# Patient Record
Sex: Female | Born: 1960 | Race: White | Hispanic: No | Marital: Single | Smoking: Former smoker
Health system: Southern US, Community
[De-identification: ages and names within clinical notes are randomized; demographics above are authoritative.]

## PROBLEM LIST (undated history)

## (undated) DIAGNOSIS — J449 Chronic obstructive pulmonary disease, unspecified: Secondary | ICD-10-CM

---

## 2017-12-07 ENCOUNTER — Emergency Department (HOSPITAL_COMMUNITY)
Admission: EM | Admit: 2017-12-07 | Discharge: 2017-12-07 | Disposition: A | Discharge status: Home / Self Care | Payer: BLUE CROSS/BLUE SHIELD | Attending: Emergency Medicine | Admitting: Emergency Medicine

## 2017-12-07 ENCOUNTER — Encounter (HOSPITAL_COMMUNITY): Payer: Self-pay

## 2017-12-07 ENCOUNTER — Other Ambulatory Visit: Payer: Self-pay

## 2017-12-07 ENCOUNTER — Emergency Department (HOSPITAL_COMMUNITY): Payer: BLUE CROSS/BLUE SHIELD

## 2017-12-07 DIAGNOSIS — M542 Cervicalgia: Secondary | ICD-10-CM | POA: Insufficient documentation

## 2017-12-07 DIAGNOSIS — J449 Chronic obstructive pulmonary disease, unspecified: Secondary | ICD-10-CM | POA: Diagnosis not present

## 2017-12-07 HISTORY — DX: Chronic obstructive pulmonary disease, unspecified: J44.9

## 2017-12-07 NOTE — ED Triage Notes (Signed)
Pt states after an incident in 2007 she has had back and neck problems. States c2 that is possibly bulging. States she "has other things she needs checked today while she is here" and relays a head injury from about a month ago from hitting her head on the freezer door.

## 2017-12-07 NOTE — ED Provider Notes (Signed)
[facility] DEPT Provider Note   CSN: [id number] Arrival date & time: 12/07/17  1316     History   Chief Complaint Chief Complaint  Patient presents with  . Neck Pain    HPI Suzanne Carroll is a 57 y.o. female.  HPI   Patient is a 57 year old female with a history of COPD, fibromyalgia, chronic low back pain and history of neck injury (2007) who presents the emergency department today complaining of neck pain that began several days ago.  Patient states that she was babysitting a small child and felt immediate pain several days ago after lifting the child.  Since then she states she has had pain radiating down the bilateral arms, more so on the right.  She only has intermittent radiation of pain down the left arm.  None currently.  Also reports pain to her right buttock that radiates down the right side of her leg.  She feels this is unrelated to her chronic back pain.  She has tried taking Percocet given to her by her PCP.  She has not followed up with her PCP for the symptoms.  States she has seen neurosurgery in the past for her neck pain and was told that she had "bulging in my C2 ".  She states that her neurosurgeon told her "eventually I may get a herniated disc in this area and I will know it when it happens ".  She denies persistent numbness or weakness to her arms or legs.  No loss control or bowel or bladder function.  No urinary retention.  No fevers chills.  No history of IV drug use or dx of cancer.  Past Medical History:  Diagnosis Date  . COPD (chronic obstructive pulmonary disease) (HCC)     There are no active problems to display for this patient.   History reviewed. No pertinent surgical history.   OB History   None     Home Medications    Prior to Admission medications   Not on File    Family History History reviewed. No pertinent family history.  Social History Social History   Tobacco Use  . Smoking status: Former  Research scientist (life sciences)  . Smokeless tobacco: Never Used  Substance Use Topics  . Alcohol use: Not Currently    Frequency: Never  . Drug use: Never     Allergies   Aspirin   Review of Systems Review of Systems  Constitutional: Negative for fever.  Respiratory: Negative for shortness of breath.   Cardiovascular: Negative for chest pain.  Gastrointestinal: Negative for abdominal pain, blood in stool, constipation, diarrhea, nausea and vomiting.  Genitourinary: Negative for dysuria, flank pain, frequency, hematuria and urgency.  Musculoskeletal: Positive for neck pain. Negative for back pain and gait problem.  Skin: Negative for wound.  Neurological: Negative for weakness and numbness.       Tingling to RUE and RLE (intermittent)     Physical Exam Updated Vital Signs BP 128/90 (BP Location: Left Arm)   Pulse (!) 111   Temp 97.9 F (36.6 C) (Oral)   Resp 18   Ht 5\' 2"  (1.575 m)   Wt 44.5 kg   SpO2 100%   BMI 17.92 kg/m   Physical Exam  Constitutional: No distress.  Thin appearing female.  Appears older than stated age.  HENT:  Head: Normocephalic and atraumatic.  Eyes: Conjunctivae are normal.  Neck: Neck supple.  Cardiovascular: Regular rhythm.  No murmur heard. Borderline tachycardic.  Pulmonary/Chest: Effort normal and  breath sounds normal. No respiratory distress.  Abdominal: Soft. There is no tenderness.  Musculoskeletal: She exhibits no edema.  Mild midline cervical spine tenderness, however patient easily distractible.  No point tenderness to the midline cervical spine.  Tenderness to the bilateral cervical paraspinous muscles and along the right trapezius muscle that reproduces her pain.  Strength 5/5 to the bilateral upper and lower extremity's.  Normal sensation throughout.  Ambulatory without difficulty.  Mild diffuse midline lumbar spine tenderness that she states is chronic.  Neurological: She is alert.  Skin: Skin is warm and dry.  Psychiatric:  Anxious.  Nursing  note and vitals reviewed.  ED Treatments / Results  Labs (all labs ordered are listed, but only abnormal results are displayed) Labs Reviewed - No data to display  EKG None  Radiology Dg Cervical Spine Complete  Result Date: 12/07/2017 CLINICAL DATA:  Neck pain. EXAM: CERVICAL SPINE - COMPLETE 4+ VIEW COMPARISON:  None. FINDINGS: No fracture or spondylolisthesis is noted. Mild degenerative disc disease is noted at C5-6 and C6-7 with anterior osteophyte formation. No prevertebral soft tissue swelling is noted. Mild bilateral neural foraminal stenosis is noted at these levels secondary to uncovertebral spurring. IMPRESSION: Mild degenerative disc disease is noted at C5-6 and C6-7 with mild bilateral neural foraminal stenosis at these levels secondary to uncovertebral spurring. No acute abnormality seen in the cervical spine. Electronically Signed   By: Marijo Conception, M.D.   On: 12/07/2017 16:49    Procedures Procedures (including critical care time)  Medications Ordered in ED Medications - No data to display   Initial Impression / Assessment and Plan / ED Course  I have reviewed the triage vital signs and the nursing notes.  Pertinent labs & imaging results that were available during my care of the patient were reviewed by me and considered in my medical decision making (see chart for details).   Reviewed patient is a [employer].  She filled a prescription for 60 Percocet on 12/05/2017.  She has been receiving Percocet from the same provider in [place] for the last several years.  Final Clinical Impressions(s) / ED Diagnoses   Final diagnoses:  Neck pain   Patient here complaining of neck pain after lifting a small child several days ago.  She has tenderness to the bilateral cervical paraspinous muscles in the right trapezius muscle.  No significant midline cervical spine tenderness, she is very distractible on exam does not have any point tenderness.   Cervical spine x-ray completed which noted mild degenerative disc disease at C5-6 and C6-7 with mild bilateral neural foraminal stenosis at these levels secondary to uncovertebral spurring. No acute abnormality seen in the cervical spine.  Her neurologic exam is normal.  Suspect radiculopathy versus muscle spasm due to recent heavy lifting.  Doubt other acute pathology that would require further imaging or admission to the hospital.  We will have her follow-up with neurosurgery and/or her PCP who currently manages her chronic pain with Percocet as noted above.  Have advised her to return to the ER for any new or worsening symptoms in the meantime.  She voices understanding of the plan and reasons to return to the ED peer all questions answered.  ED Discharge Orders    None       Rodney Booze, [place] 12/07/17 1707    Margette Fast, MD 12/07/17 1718

## 2017-12-07 NOTE — Discharge Instructions (Signed)
You should continue taking your regularly prescribed Percocet at home for your pain.  You may add ibuprofen for your pain as well.  You may use warm or cool compresses for your symptoms.  You were given a referral to the neurosurgery (spine) doctor.  You will need to call the office to make an appointment for follow-up.  If you have any new or worsening symptoms in the meantime such as weakness in your arms or legs, numbness in your arms or legs, fevers, chills or worsening pain then you should return to the emergency department immediately.

## 2017-12-29 ENCOUNTER — Other Ambulatory Visit: Payer: Self-pay | Admitting: Family Medicine

## 2017-12-29 DIAGNOSIS — M542 Cervicalgia: Secondary | ICD-10-CM

## 2017-12-29 DIAGNOSIS — R911 Solitary pulmonary nodule: Secondary | ICD-10-CM

## 2017-12-29 DIAGNOSIS — G8929 Other chronic pain: Secondary | ICD-10-CM

## 2018-01-02 ENCOUNTER — Other Ambulatory Visit (HOSPITAL_COMMUNITY): Payer: Self-pay | Admitting: Family Medicine

## 2018-01-02 DIAGNOSIS — R911 Solitary pulmonary nodule: Secondary | ICD-10-CM

## 2018-01-02 DIAGNOSIS — G8929 Other chronic pain: Secondary | ICD-10-CM

## 2018-01-02 DIAGNOSIS — M542 Cervicalgia: Principal | ICD-10-CM

## 2018-01-16 ENCOUNTER — Other Ambulatory Visit: Payer: BLUE CROSS/BLUE SHIELD

## 2018-02-06 ENCOUNTER — Inpatient Hospital Stay (HOSPITAL_COMMUNITY)
Admission: AD | Admit: 2018-02-06 | Discharge: 2018-02-15 | DRG: 314 | Disposition: A | Discharge status: Skilled Nursing Facility | Payer: BLUE CROSS/BLUE SHIELD | Source: Other Acute Inpatient Hospital | Attending: Internal Medicine | Admitting: Internal Medicine

## 2018-02-06 DIAGNOSIS — E43 Unspecified severe protein-calorie malnutrition: Secondary | ICD-10-CM

## 2018-02-06 DIAGNOSIS — E876 Hypokalemia: Secondary | ICD-10-CM | POA: Diagnosis present

## 2018-02-06 DIAGNOSIS — F411 Generalized anxiety disorder: Secondary | ICD-10-CM | POA: Diagnosis present

## 2018-02-06 DIAGNOSIS — Z681 Body mass index (BMI) 19 or less, adult: Secondary | ICD-10-CM

## 2018-02-06 DIAGNOSIS — C3490 Malignant neoplasm of unspecified part of unspecified bronchus or lung: Secondary | ICD-10-CM | POA: Diagnosis present

## 2018-02-06 DIAGNOSIS — D630 Anemia in neoplastic disease: Secondary | ICD-10-CM | POA: Diagnosis present

## 2018-02-06 DIAGNOSIS — C7951 Secondary malignant neoplasm of bone: Secondary | ICD-10-CM | POA: Diagnosis present

## 2018-02-06 DIAGNOSIS — R64 Cachexia: Secondary | ICD-10-CM | POA: Diagnosis present

## 2018-02-06 DIAGNOSIS — M1990 Unspecified osteoarthritis, unspecified site: Secondary | ICD-10-CM | POA: Diagnosis present

## 2018-02-06 DIAGNOSIS — Z7189 Other specified counseling: Secondary | ICD-10-CM | POA: Diagnosis not present

## 2018-02-06 DIAGNOSIS — J449 Chronic obstructive pulmonary disease, unspecified: Secondary | ICD-10-CM | POA: Diagnosis present

## 2018-02-06 DIAGNOSIS — J9601 Acute respiratory failure with hypoxia: Secondary | ICD-10-CM | POA: Diagnosis present

## 2018-02-06 DIAGNOSIS — R140 Abdominal distension (gaseous): Secondary | ICD-10-CM | POA: Diagnosis present

## 2018-02-06 DIAGNOSIS — Z9889 Other specified postprocedural states: Secondary | ICD-10-CM

## 2018-02-06 DIAGNOSIS — J910 Malignant pleural effusion: Secondary | ICD-10-CM | POA: Diagnosis present

## 2018-02-06 DIAGNOSIS — Z79891 Long term (current) use of opiate analgesic: Secondary | ICD-10-CM

## 2018-02-06 DIAGNOSIS — C787 Secondary malignant neoplasm of liver and intrahepatic bile duct: Secondary | ICD-10-CM | POA: Diagnosis present

## 2018-02-06 DIAGNOSIS — C7900 Secondary malignant neoplasm of unspecified kidney and renal pelvis: Secondary | ICD-10-CM | POA: Diagnosis present

## 2018-02-06 DIAGNOSIS — G893 Neoplasm related pain (acute) (chronic): Secondary | ICD-10-CM | POA: Diagnosis present

## 2018-02-06 DIAGNOSIS — I313 Pericardial effusion (noninflammatory): Secondary | ICD-10-CM | POA: Diagnosis present

## 2018-02-06 DIAGNOSIS — I314 Cardiac tamponade: Secondary | ICD-10-CM | POA: Diagnosis present

## 2018-02-06 DIAGNOSIS — I361 Nonrheumatic tricuspid (valve) insufficiency: Secondary | ICD-10-CM | POA: Diagnosis not present

## 2018-02-06 DIAGNOSIS — C7A1 Malignant poorly differentiated neuroendocrine tumors: Secondary | ICD-10-CM | POA: Diagnosis present

## 2018-02-06 DIAGNOSIS — C801 Malignant (primary) neoplasm, unspecified: Secondary | ICD-10-CM | POA: Diagnosis present

## 2018-02-06 DIAGNOSIS — M4804 Spinal stenosis, thoracic region: Secondary | ICD-10-CM | POA: Diagnosis present

## 2018-02-06 DIAGNOSIS — R0602 Shortness of breath: Secondary | ICD-10-CM | POA: Diagnosis present

## 2018-02-06 DIAGNOSIS — M797 Fibromyalgia: Secondary | ICD-10-CM | POA: Diagnosis present

## 2018-02-06 DIAGNOSIS — Z87891 Personal history of nicotine dependence: Secondary | ICD-10-CM

## 2018-02-06 DIAGNOSIS — Z886 Allergy status to analgesic agent status: Secondary | ICD-10-CM

## 2018-02-06 DIAGNOSIS — Z79899 Other long term (current) drug therapy: Secondary | ICD-10-CM

## 2018-02-06 DIAGNOSIS — K219 Gastro-esophageal reflux disease without esophagitis: Secondary | ICD-10-CM | POA: Diagnosis present

## 2018-02-06 DIAGNOSIS — R000 Tachycardia, unspecified: Secondary | ICD-10-CM | POA: Diagnosis not present

## 2018-02-06 DIAGNOSIS — I509 Heart failure, unspecified: Secondary | ICD-10-CM | POA: Diagnosis present

## 2018-02-06 DIAGNOSIS — Z820 Family history of epilepsy and other diseases of the nervous system: Secondary | ICD-10-CM

## 2018-02-06 DIAGNOSIS — R600 Localized edema: Secondary | ICD-10-CM | POA: Diagnosis not present

## 2018-02-06 DIAGNOSIS — Z801 Family history of malignant neoplasm of trachea, bronchus and lung: Secondary | ICD-10-CM

## 2018-02-06 DIAGNOSIS — Z882 Allergy status to sulfonamides status: Secondary | ICD-10-CM

## 2018-02-06 DIAGNOSIS — J9811 Atelectasis: Secondary | ICD-10-CM | POA: Diagnosis present

## 2018-02-06 DIAGNOSIS — I3131 Malignant pericardial effusion in diseases classified elsewhere: Secondary | ICD-10-CM | POA: Diagnosis present

## 2018-02-06 DIAGNOSIS — Z515 Encounter for palliative care: Secondary | ICD-10-CM

## 2018-02-07 ENCOUNTER — Inpatient Hospital Stay (HOSPITAL_COMMUNITY): Payer: BLUE CROSS/BLUE SHIELD

## 2018-02-07 ENCOUNTER — Encounter (HOSPITAL_COMMUNITY)
Admission: AD | Disposition: A | Discharge status: Skilled Nursing Facility | Payer: Self-pay | Source: Other Acute Inpatient Hospital | Attending: Internal Medicine

## 2018-02-07 DIAGNOSIS — R0602 Shortness of breath: Secondary | ICD-10-CM

## 2018-02-07 DIAGNOSIS — I361 Nonrheumatic tricuspid (valve) insufficiency: Secondary | ICD-10-CM

## 2018-02-07 DIAGNOSIS — I3131 Malignant pericardial effusion in diseases classified elsewhere: Secondary | ICD-10-CM | POA: Diagnosis present

## 2018-02-07 DIAGNOSIS — J910 Malignant pleural effusion: Secondary | ICD-10-CM

## 2018-02-07 DIAGNOSIS — C801 Malignant (primary) neoplasm, unspecified: Secondary | ICD-10-CM | POA: Diagnosis present

## 2018-02-07 DIAGNOSIS — C3490 Malignant neoplasm of unspecified part of unspecified bronchus or lung: Secondary | ICD-10-CM | POA: Diagnosis present

## 2018-02-07 DIAGNOSIS — I313 Pericardial effusion (noninflammatory): Principal | ICD-10-CM

## 2018-02-07 HISTORY — PX: PERICARDIOCENTESIS: CATH118255

## 2018-02-07 LAB — CBC
HCT: 35.8 % — ABNORMAL LOW (ref 36.0–46.0)
Hemoglobin: 10.7 g/dL — ABNORMAL LOW (ref 12.0–15.0)
MCH: 26.3 pg (ref 26.0–34.0)
MCHC: 29.9 g/dL — ABNORMAL LOW (ref 30.0–36.0)
MCV: 88 fL (ref 80.0–100.0)
Platelets: 572 10*3/uL — ABNORMAL HIGH (ref 150–400)
RBC: 4.07 MIL/uL (ref 3.87–5.11)
RDW: 14.9 % (ref 11.5–15.5)
WBC: 10.9 10*3/uL — ABNORMAL HIGH (ref 4.0–10.5)
nRBC: 0 % (ref 0.0–0.2)

## 2018-02-07 LAB — GRAM STAIN

## 2018-02-07 LAB — TYPE AND SCREEN
ABO/RH(D): A POS
Antibody Screen: NEGATIVE

## 2018-02-07 LAB — TROPONIN I
Troponin I: <0.03 ng/mL (ref <0.03)
Troponin I: <0.03 ng/mL (ref <0.03)
Troponin I: 0.03 ng/mL (ref <0.03)

## 2018-02-07 LAB — ECHOCARDIOGRAM COMPLETE
Height: 61 in
Weight: 1628.8 oz

## 2018-02-07 LAB — BASIC METABOLIC PANEL
Anion gap: 12 (ref 5–15)
BUN: 13 mg/dL (ref 6–20)
CO2: 27 mmol/L (ref 22–32)
CREATININE: 0.5 mg/dL (ref 0.44–1.00)
Calcium: 8.4 mg/dL — ABNORMAL LOW (ref 8.9–10.3)
Chloride: 105 mmol/L (ref 98–111)
GFR calc Af Amer: >60 mL/min (ref >60)
GFR calc non Af Amer: >60 mL/min (ref >60)
Glucose, Bld: 105 mg/dL — ABNORMAL HIGH (ref 70–99)
Potassium: 3.3 mmol/L — ABNORMAL LOW (ref 3.5–5.1)
Sodium: 144 mmol/L (ref 135–145)

## 2018-02-07 LAB — BODY FLUID CELL COUNT WITH DIFFERENTIAL
Lymphs, Fluid: 11 %
MONOCYTE-MACROPHAGE-SEROUS FLUID: 56 % (ref 50–90)
Neutrophil Count, Fluid: 33 % — ABNORMAL HIGH (ref 0–25)
Total Nucleated Cell Count, Fluid: 284 cu mm (ref 0–1000)

## 2018-02-07 LAB — ABO/RH: ABO/RH(D): A POS

## 2018-02-07 LAB — MRSA PCR SCREENING: MRSA by PCR: NEGATIVE

## 2018-02-07 SURGERY — PERICARDIOCENTESIS
Anesthesia: LOCAL

## 2018-02-07 MED ORDER — ENOXAPARIN SODIUM 30 MG/0.3ML ~~LOC~~ SOLN
30.00 | SUBCUTANEOUS | Status: DC
Start: 2018-02-05 — End: 2018-02-07

## 2018-02-07 MED ORDER — LORAZEPAM 2 MG/ML IJ SOLN
0.5000 mg | Freq: Four times a day (QID) | INTRAMUSCULAR | Status: DC | PRN
  Administered 2018-02-07 – 2018-02-13 (×3): 0.5 mg via INTRAVENOUS
  Filled 2018-02-07 (×3): qty 1

## 2018-02-07 MED ORDER — MORPHINE SULFATE (CONCENTRATE) 10 MG /0.5 ML PO SOLN
15.00 | ORAL | Status: DC
Start: ? — End: 2018-02-07

## 2018-02-07 MED ORDER — SODIUM CHLORIDE 0.9 % IV SOLN: 250.0000 mL | INTRAVENOUS | Status: DC | PRN

## 2018-02-07 MED ORDER — HEPARIN SODIUM (PORCINE) 5000 UNIT/ML IJ SOLN
5000.0000 [IU] | Freq: Three times a day (TID) | INTRAMUSCULAR | Status: DC
  Administered 2018-02-07 – 2018-02-15 (×23): 5000 [IU] via SUBCUTANEOUS
  Filled 2018-02-07 (×23): qty 1

## 2018-02-07 MED ORDER — ENOXAPARIN SODIUM 40 MG/0.4ML ~~LOC~~ SOLN: 40.0000 mg | SUBCUTANEOUS | Status: DC

## 2018-02-07 MED ORDER — PSYLLIUM 51.7 % PO PACK
1.00 | PACK | ORAL | Status: DC
Start: 2018-02-05 — End: 2018-02-07

## 2018-02-07 MED ORDER — ONDANSETRON HCL 4 MG PO TABS: 4.0000 mg | ORAL_TABLET | Freq: Four times a day (QID) | ORAL | Status: DC | PRN

## 2018-02-07 MED ORDER — GENERIC EXTERNAL MEDICATION
1.00 | Status: DC
Start: 2018-02-05 — End: 2018-02-07

## 2018-02-07 MED ORDER — IPRATROPIUM-ALBUTEROL 0.5-2.5 (3) MG/3ML IN SOLN: 3.0000 mL | RESPIRATORY_TRACT | Status: DC | PRN

## 2018-02-07 MED ORDER — SODIUM CHLORIDE 0.9 % IV SOLN
INTRAVENOUS | Status: AC
  Administered 2018-02-07: 17:00:00 via INTRAVENOUS

## 2018-02-07 MED ORDER — OXYCODONE HCL 5 MG PO TABS
5.0000 mg | ORAL_TABLET | ORAL | Status: DC | PRN
  Administered 2018-02-08 – 2018-02-09 (×3): 10 mg via ORAL
  Filled 2018-02-07 (×3): qty 2

## 2018-02-07 MED ORDER — FENTANYL CITRATE (PF) 100 MCG/2ML IJ SOLN
INTRAMUSCULAR | Status: AC
  Filled 2018-02-07: qty 2

## 2018-02-07 MED ORDER — GENERIC EXTERNAL MEDICATION
Status: DC
Start: ? — End: 2018-02-07

## 2018-02-07 MED ORDER — LIDOCAINE HCL (PF) 1 % IJ SOLN
INTRAMUSCULAR | Status: AC
  Filled 2018-02-07: qty 30

## 2018-02-07 MED ORDER — HEPARIN (PORCINE) IN NACL 1000-0.9 UT/500ML-% IV SOLN
INTRAVENOUS | Status: AC
  Filled 2018-02-07: qty 500

## 2018-02-07 MED ORDER — LORAZEPAM 1 MG PO TABS
1.00 | ORAL_TABLET | ORAL | Status: DC
Start: ? — End: 2018-02-07

## 2018-02-07 MED ORDER — LIDOCAINE HCL (PF) 1 % IJ SOLN
INTRAMUSCULAR | Status: DC | PRN
  Administered 2018-02-07: 10 mL

## 2018-02-07 MED ORDER — POLYETHYLENE GLYCOL 3350 17 G PO PACK
17.00 | PACK | ORAL | Status: DC
Start: 2018-02-05 — End: 2018-02-07

## 2018-02-07 MED ORDER — FENTANYL CITRATE (PF) 100 MCG/2ML IJ SOLN
INTRAMUSCULAR | Status: DC | PRN
  Administered 2018-02-07: 25 ug via INTRAVENOUS

## 2018-02-07 MED ORDER — ALBUTEROL SULFATE HFA 108 (90 BASE) MCG/ACT IN AERS
2.00 | INHALATION_SPRAY | RESPIRATORY_TRACT | Status: DC
Start: ? — End: 2018-02-07

## 2018-02-07 MED ORDER — ONDANSETRON 4 MG PO TBDP
4.00 | ORAL_TABLET | ORAL | Status: DC
Start: ? — End: 2018-02-07

## 2018-02-07 MED ORDER — HEPARIN (PORCINE) IN NACL 1000-0.9 UT/500ML-% IV SOLN
INTRAVENOUS | Status: DC | PRN
  Administered 2018-02-07: 500 mL

## 2018-02-07 MED ORDER — SODIUM CHLORIDE 0.9 % IV SOLN
INTRAVENOUS | Status: DC
  Administered 2018-02-07 (×2): via INTRAVENOUS

## 2018-02-07 MED ORDER — POTASSIUM CHLORIDE CRYS ER 20 MEQ PO TBCR
40.0000 meq | EXTENDED_RELEASE_TABLET | Freq: Once | ORAL | Status: AC
  Administered 2018-02-07: 40 meq via ORAL
  Filled 2018-02-07: qty 2

## 2018-02-07 MED ORDER — SODIUM CHLORIDE 0.9% FLUSH
3.0000 mL | Freq: Two times a day (BID) | INTRAVENOUS | Status: DC
  Administered 2018-02-08 – 2018-02-14 (×12): 3 mL via INTRAVENOUS

## 2018-02-07 MED ORDER — ACETAMINOPHEN 650 MG RE SUPP: 650.0000 mg | Freq: Four times a day (QID) | RECTAL | Status: DC | PRN

## 2018-02-07 MED ORDER — ONDANSETRON HCL 4 MG/2ML IJ SOLN: 4.0000 mg | Freq: Four times a day (QID) | INTRAMUSCULAR | Status: DC | PRN

## 2018-02-07 MED ORDER — ACETAMINOPHEN 325 MG PO TABS: 650.0000 mg | ORAL_TABLET | ORAL | Status: DC | PRN

## 2018-02-07 MED ORDER — ACETAMINOPHEN 325 MG PO TABS: 650.0000 mg | ORAL_TABLET | Freq: Four times a day (QID) | ORAL | Status: DC | PRN

## 2018-02-07 MED ORDER — SODIUM CHLORIDE 0.9% FLUSH: 3.0000 mL | INTRAVENOUS | Status: DC | PRN

## 2018-02-07 MED ORDER — MORPHINE SULFATE (PF) 2 MG/ML IV SOLN
2.0000 mg | INTRAVENOUS | Status: DC | PRN
  Administered 2018-02-07 – 2018-02-15 (×45): 2 mg via INTRAVENOUS
  Filled 2018-02-07 (×46): qty 1

## 2018-02-07 MED ORDER — SENNOSIDES 8.6 MG PO TABS
2.00 | ORAL_TABLET | ORAL | Status: DC
Start: 2018-02-05 — End: 2018-02-07

## 2018-02-07 SURGICAL SUPPLY — 7 items
ELECT DEFIB PAD ADLT CADENCE (PAD) ×2 IMPLANT
PACK CARDIAC CATHETERIZATION (CUSTOM PROCEDURE TRAY) ×2 IMPLANT
PERIVAC PERICARDIOCENTESIS 8.3 (TRAY / TRAY PROCEDURE) ×2 IMPLANT
SHEATH PROBE COVER 6X72 (BAG) ×2 IMPLANT
TRANSDUCER W/STOPCOCK (MISCELLANEOUS) ×2 IMPLANT
TUBING ART PRESS 72  MALE/FEM (TUBING) ×2
TUBING ART PRESS 72 MALE/FEM (TUBING) ×2 IMPLANT

## 2018-02-07 NOTE — Progress Notes (Signed)
Discussed patient with CVTS.  CT scan reviewed by Dr. Darcey Nora showing total obstruction of the right mainstem bronchus and subtotal obstruction of the left mainstem bronchus.  She has a large pericardial effusion 2.5 cm in diameter with RV and RA compression and respiratory variation her mitral valve inflow and is tachycardic.  Dr. Darcey Nora feels that she would not survive hospitalization and I are in agreement that she has a terminal illness and will likely not survive this hospitalization.  She is not a candidate for pericardial window under general anesthesia because once intubated would be ventilator dependent from there on out.  The patient was offered a pericardiocentesis at [facility] and was then sent here to get 1 done and is expecting to have this done.  I had a very long upfront conversation with the patient.  I have explained to her that she has a terminal illness but she will likely not recover from.  Given that she has significant obstruction of both mainstem bronchi, this is the likely etiology of her shortness of breath and not the pericardial effusion although it is probably contributing some.  We would be willing to do the pericardiocentesis as I reviewed the films with Dr. Daneen Schick.  I did explain to the patient though that this was only a temporizing measure and given the likelihood that this is a malignant effusion will likely reaccumulate in the near future.  She is adamant about proceeding with pericardiocentesis.  I explained the risks of the procedure including right ventricular tear, infection, arrhythmia, cardiac arrest and death.  She understands the risk and is willing to proceed.

## 2018-02-07 NOTE — Progress Notes (Addendum)
Progress Note  Patient Name: Suzanne Carroll Date of Encounter: 02/07/2018  Primary Cardiologist: No primary care provider on file.   Subjective   He needs to be very short of breath lying in bed.  She is also nauseated this morning as well.  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  Initially refused chemotherapy and was found to have a large pericardial effusion with RV compression and significant mitral inflow variation with respiration but patient declined pericardiocentesis few days ago and wanted to be closer to home.  Readmitted with worsening shortness of breath with chest CTA showing extensive right lower lobe tumor with bulky thoracic lymphadenopathy large pericardial effusion and multifocal opacities with possible pneumonia and osseous metastasis.  She is now transferred here for possible pericardial window.  Inpatient Medications    Scheduled Meds:  Continuous Infusions: . sodium chloride 75 mL/hr at 02/07/18 0417   PRN Meds: acetaminophen **OR** acetaminophen, ipratropium-albuterol, LORazepam, morphine injection, ondansetron **OR** ondansetron (ZOFRAN) IV   Vital Signs    Vitals:   02/06/18 2220 02/07/18 0231 02/07/18 0413  BP: (!) 125/93 109/89   Pulse:   (!) 109  Resp:   (!) 23  Temp: 98.4 F (36.9 C)  98 F (36.7 C)  TempSrc: Oral  Oral  Weight: 46.2 kg    Height: 5\' 1"  (1.549 m)     No intake or output data in the 24 hours ending 02/07/18 0803 Filed Weights   02/06/18 2220  Weight: 46.2 kg    Telemetry    Sinus tachycardia- Personally Reviewed  ECG    No new EKG to review- Personally Reviewed  Physical Exam   GEN:  Very thin and cachectic appearing in mild distress secondary to shortness of breath Neck: No JVD Cardiac:  Regular and tachycardic, no murmurs, rubs, or gallops.  Respiratory: Clear to auscultation bilaterally. GI: Soft, nontender,  non-distended  MS: No edema; No deformity. Neuro:  Nonfocal  Psych: Normal affect   Labs    Chemistry Recent Labs  Lab 02/07/18 0353  NA 144  K 3.3*  CL 105  CO2 27  GLUCOSE 105*  BUN 13  CREATININE 0.50  CALCIUM 8.4*  GFRNONAA >60  GFRAA >60  ANIONGAP 12     Hematology Recent Labs  Lab 02/07/18 0353  WBC 10.9*  RBC 4.07  HGB 10.7*  HCT 35.8*  MCV 88.0  MCH 26.3  MCHC 29.9*  RDW 14.9  PLT 572*    Cardiac Enzymes Recent Labs  Lab 02/07/18 0353  TROPONINI <0.03   No results for input(s): TROPIPOC in the last 168 hours.   BNPNo results for input(s): BNP, PROBNP in the last 168 hours.   DDimer No results for input(s): DDIMER in the last 168 hours.   Radiology    No results found.  Cardiac Studies   none  Patient Profile     57 y.o. 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  Initially refused chemotherapy and was found to have a large pericardial effusion with RV compression and significant mitral inflow variation with respiration but patient declined pericardiocentesis few days ago and wanted to be closer to home.  Readmitted with worsening shortness of breath with chest CTA showing extensive right lower lobe tumor with bulky thoracic lymphadenopathy large pericardial effusion and multifocal opacities with  possible pneumonia and osseous metastasis.  She is now transferred here for possible pericardial window.  Assessment & Plan    1.  Large pericardial effusion with possible pericardial tamponade on echo at outside hospital -Is likely malignant given her widely metastatic lung CA. -Patient is very cachectic and ill-appearing with widely metastatic disease.  Chest CT showed large lung tumors possible postobstructive pneumonia and multiple osseous metastasis.  I suspect that her pericardial effusion is only part of what is causing her chest discomfort and shortness  of breath. -The patient had refused chemotherapy in the past but now wants to try chemotherapy wants to pericardiocentesis has been done. -I explained to her that she may not be a candidate for pericardial window given the degree of tumor burden in her chest and doing just pericardiocentesis without a window would only be a temporizing measure and the effusion would likely reaccumulate. -I think we need to look at the overall big picture here with widespread metastatic cancer with significant lung involvement.  I recommend getting oncology involved and get their thoughts on patient's prognosis going forward which I suspect is very poor.  She likely should be seen by palliative care. -I will get a stat 2D echocardiogram to reassess but would not proceed with consulting CVTS for pericardial window until goals of care have been addressed further -This plan has been discussed with Dr. Clementeen Graham.  2.  Widespread metastatic small cell lung CA -Recommend immediate oncology consult to determine prognosis going forward which I assume is poor -Likely needs palliative care consult    I have spent a total of 35 minutes with patient reviewing  telemetry, EKGs, labs and examining patient as well as establishing an assessment and plan that was discussed with the patient.  > 50% of time was spent in direct patient care.    For questions or updates, please contact [place] Please consult [email or web address] for contact info under Cardiology/STEMI.      Signed, Fransico Him, MD  02/07/2018, 8:03 AM

## 2018-02-07 NOTE — Progress Notes (Signed)
Echocardiogram 2D Echocardiogram has been performed.  Suzanne Carroll 02/07/2018, 9:33 AM

## 2018-02-07 NOTE — H&P (Signed)
History and Physical    Suzanne Carroll IRW:[id number] DOB: 10-24-1960 DOA: 02/06/2018  Referring MD/NP/PA: Vernell Leep, MD PCP: Patient, No Pcp Per  Patient coming from: [facility] transfer  Chief Complaint: Shortness of breathe  I have personally briefly reviewed patient's old medical records in [place]   HPI: Suzanne Carroll is a 57 y.o. 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  Patient had just recently been hospitalized at [facility] from 12/7 through 12/29.  During her hospitalization she was evaluated and found to have pathologic acute anterior trochanteric fracture of the left hip requiring surgical intervention on 12/8.  CT angiogram of the chest, abdomen, and pelvis showed innumerable tumors involving the kidneys, pancreas, liver, lung, right adrenal, peritoneum,  retroperitoneum, and bone.  Bone biopsies obtained during her surgery revealed small cell lung cancer.  During the hospitalization it appears that oncology has been consulted to discuss starting chemotherapy, but patient reportedly refused.  Patient was also noted to have a pericardial effusion and underwent TEE on 12/12 which showed stable inconsistent pericardial effusion with no signs of RV collapse, compression, or exaggerated respiratory variations.  She had repeat echocardiogram on 12/28 which showed large pericardial effusion with elevated intrapericardial pressure and early tamponade physiology.  Patient was sent to skilled nursing facility at discharge as it appeared at that time she wanted second opinion.   However, developed worsening shortness of breath and pleuritic chest pain radiating to her back on 12/30.  She complains of associated symptoms of leg swelling and joint pains.  Initial work-up revealed at [facility] temperature 97.5, pulse 108, respiration  28, blood pressure 122/81, O2 saturation 98% on 2 L of nasal cannula oxygen labs revealed WBC 10.2, hemoglobin 11.6, platelets 577, sodium 140, potassium 3.3, chloride 105, CO2 30, BUN 16, creatinine 0.4, and glucose 109.  CT angiogram of the chest showed extensive tumor in the right lower lobe with bulky thoracic lymphatic apathy and progressive metastases along the right heart border associated with a large right-sided pleural cardiac effusion and small to moderate right pleural effusion, small left pleural effusion, multifocal lytic osseous metastases, and multifocal opacities possibly reflecting multifocal pneumonia less likely interstitial edema.  Transfer was requested for need of pericardial window.  Patient was accepted to a cardiac telemetry bed as inpatient.  ED Course: As seen above   Review of Systems  Unable to perform ROS: Acuity of condition  Constitutional: Positive for malaise/fatigue.  Respiratory: Positive for shortness of breath.   Cardiovascular: Positive for chest pain and leg swelling.  Musculoskeletal: Positive for joint pain.    Past Medical History:  Diagnosis Date  . COPD (chronic obstructive pulmonary disease) (HCC)     No past surgical history on file.   reports that she has quit smoking. She has never used smokeless tobacco. She reports previous alcohol use. She reports that she does not use drugs.  Allergies  Allergen Reactions  . Aspirin Hives    No family history on file.  Prior to Admission medications   Not on File    Physical Exam:  Constitutional: Hectic appearing female who appears to be in distress Vitals:   02/06/18 2220  BP: (!) 125/93  Temp: 98.4 F (36.9 C)  TempSrc: Oral  Weight: 46.2 kg  Height: 5\' 1"  (1.549 m)   Eyes: PERRL, lids and conjunctivae normal ENMT: Mucous membranes are  moist. Posterior pharynx clear of any exudate or lesions.  Neck: normal, supple, no masses, no thyromegaly.  Positive JVD appreciated. Respiratory:  clear to auscultation bilaterally, no wheezing, no crackles. Normal respiratory effort. No accessory muscle use.  Cardiovascular: Tachycardic.  +2 pitting lower extremity edema. 2+ pedal pulses. No carotid bruits.  Abdomen: no tenderness, no masses palpated. No hepatosplenomegaly. Bowel sounds positive.  Musculoskeletal: no clubbing / cyanosis. No joint deformity upper and lower extremities. Good ROM, no contractures. Normal muscle tone.  Skin: no rashes, lesions, ulcers. No induration Neurologic: CN 2-12 grossly intact. Sensation intact, DTR normal. Strength 5/5 in all 4.  Psychiatric: Alert and oriented x3 with anxious mood.    Labs on Admission: I have personally reviewed following labs and imaging studies  CBC: No results for input(s): WBC, NEUTROABS, HGB, HCT, MCV, PLT in the last 168 hours. Basic Metabolic Panel: No results for input(s): NA, K, CL, CO2, GLUCOSE, BUN, CREATININE, CALCIUM, MG, PHOS in the last 168 hours. GFR: CrCl cannot be calculated (No successful lab value found.). Liver Function Tests: No results for input(s): AST, ALT, ALKPHOS, BILITOT, PROT, ALBUMIN in the last 168 hours. No results for input(s): LIPASE, AMYLASE in the last 168 hours. No results for input(s): AMMONIA in the last 168 hours. Coagulation Profile: No results for input(s): INR, PROTIME in the last 168 hours. Cardiac Enzymes: No results for input(s): CKTOTAL, CKMB, CKMBINDEX, TROPONINI in the last 168 hours. BNP (last 3 results) No results for input(s): PROBNP in the last 8760 hours. HbA1C: No results for input(s): HGBA1C in the last 72 hours. CBG: No results for input(s): GLUCAP in the last 168 hours. Lipid Profile: No results for input(s): CHOL, HDL, LDLCALC, TRIG, CHOLHDL, LDLDIRECT in the last 72 hours. Thyroid Function Tests: No results for input(s): TSH, T4TOTAL, FREET4, T3FREE, THYROIDAB in the last 72 hours. Anemia Panel: No results for input(s): VITAMINB12, FOLATE, FERRITIN, TIBC,  IRON, RETICCTPCT in the last 72 hours. Urine analysis: No results found for: COLORURINE, APPEARANCEUR, LABSPEC, PHURINE, GLUCOSEU, HGBUR, BILIRUBINUR, KETONESUR, PROTEINUR, UROBILINOGEN, NITRITE, LEUKOCYTESUR Sepsis Labs: No results found for this or any previous visit (from the past 240 hour(s)).   Radiological Exams on Admission: No results found.  EKG: Independently reviewed.  Sinus tachycardia 114 bpm  Assessment/Plan Malignant pericardial effusion: Acute.  Patient presents with worsening chest pain and shortness of breath.  Recent echocardiogram from 12/28 at [facility] showed worsening large pericardial effusion with EF 65 to 34%, grade 1 diastolic dysfunction, elevated intrapericardial pressure, and early tamponade physiology.  Patient sent for possible need of pericardial window. - Admit to telemetry bed - Continuous pulse oximetry with nasal cannula oxygen as needed - N.p.o. for possible procedure - Morphine IV as needed pain - Dr. Regis Bill of Cardiology consulted who recommended placing patient on gentle IV fluids  Metastatic small cell lung cancer: Patient recently diagnosed with small cell lung cancer with metastases noted in several locations after suffering pathological fracture.  Review of records shows the patient was evaluated by oncology, but did not want to start any chemotherapy or radiation. - Consider palliative care consult in a.m.  Anxiety - Ativan IV as needed for anxiety  DVT prophylaxis: SCDs   Code Status: Full  Family Communication: No family present at bedside Disposition Plan: To be determined Consults called: Cardiology Admission status: Inpatient  Norval Morton MD [facility] Pager [phone]   If 7PM-7AM, please contact night-coverage [email or web address] Password [facility]  02/07/2018, 12:57 AM

## 2018-02-07 NOTE — CV Procedure (Signed)
   Pericardiocentesis performed from the subxiphoid approach after local 1% Xylocaine infiltration.  Fluoroscopic and ultrasound guidance was used.  Greater than 800 cc of bloody fluid was evacuated from the pericardial space.  Subjective improvement in dyspnea occurred.  Pericardial fluid was sent for chemical analysis, cultures, and cytology.  No immediate complications noted.

## 2018-02-07 NOTE — Consult Note (Signed)
Cardiology Consultation Note    Patient ID: Suzanne Carroll, MRN: [id number], DOB/AGE: 04-11-1960 57 y.o. Admit date: 02/06/2018   Date of Consult: 02/07/2018 Primary Physician: Patient, No Pcp Per   Reason for Consultation: Malignant pericardial effusion Requesting MD: Dr. Tamala Julian  HPI: Suzanne Carroll is a 57 y.o. 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  She was hospitalized at [facility] from 12/7 through 12/29; the patient initially presented with acute hip pain and inability to bear weight, was ultimately found to have a pathologic left hip fracture that required surgical intervention.  Bone biopsy was consistent with metastatic small cell lung cancer.  She had CT which showed widely metastatic disease, with tumors involving the kidneys, pancreas, liver, and spine.  Oncology had been consulted and recommended immediately initiating chemotherapy, however, the patient did not agree to this.  She ultimately requested to be discharged to seek a second opinion.  During her hospital stay, she was also found to have a large pericardial effusion.  Initially there was no evidence of tamponade physiology, however repeat echo on 12/28 showed RV compression and significant mitral mitral inflow variation.  Pericardiocentesis were recommended, however the patient wanted to defer this until she was closer to home.  The patient was discharged on 12/29 to a skilled nursing facility.  She presented to [facility] yesterday with worsening shortness of breath.  Chest CTA showed extensive right lower lobe tumor, bulky thoracic lymphadenopathy, large pericardial effusion, multifocal opacities with possible pneumonia, and multifocal osseous metastases.  She was then transferred to [facility] for consideration of possible pericardiocentesis or pericardial window.  Upon my interview, the patient endorses significant shortness  of breath and pleuritic chest discomfort.    Past Medical History:  Diagnosis Date  . COPD (chronic obstructive pulmonary disease) ([place])       Surgical History: No past surgical history on file.   Home Meds: Prior to Admission medications   Not on File    Inpatient Medications:   . sodium chloride      Allergies:  Allergies  Allergen Reactions  . Aspirin Hives    Social History   Socioeconomic History  . Marital status: Single    Spouse name: Not on file  . Number of children: Not on file  . Years of education: Not on file  . Highest education level: Not on file  Occupational History  . Not on file  Social Needs  . Financial resource strain: Not on file  . Food insecurity:    Worry: Not on file    Inability: Not on file  . Transportation needs:    Medical: Not on file    Non-medical: Not on file  Tobacco Use  . Smoking status: Former Research scientist (life sciences)  . Smokeless tobacco: Never Used  Substance and Sexual Activity  . Alcohol use: Not Currently    Frequency: Never  . Drug use: Never  . Sexual activity: Not on file  Lifestyle  . Physical activity:    Days per week: Not on file    Minutes per session: Not on file  . Stress: Not on file  Relationships  . Social connections:    Talks on phone: Not on file    Gets together: Not on file    Attends religious service: Not on file    Active member of club or organization: Not on file    Attends meetings  of clubs or organizations: Not on file    Relationship status: Not on file  . Intimate partner violence:    Fear of current or ex partner: Not on file    Emotionally abused: Not on file    Physically abused: Not on file    Forced sexual activity: Not on file  Other Topics Concern  . Not on file  Social History Narrative  . Not on file     No family history on file.   Review of Systems: All other systems reviewed and are otherwise negative except as noted above.  Labs: 140/3.3  105/30  16/0.4 10.2 > 11.6 <  577  Radiology/Studies:  No results found.  Wt Readings from Last 3 Encounters:  02/06/18 46.2 kg  12/07/17 44.5 kg    EKG: Sinus tachycardia with PACs, low voltage  Physical Exam: Blood pressure 109/89, temperature 98.4 F (36.9 C), temperature source Oral, height 5\' 1"  (1.549 m), weight 46.2 kg. Body mass index is 19.23 kg/m. General: Cachectic woman sitting upright in bed, mildly tachypneic Head: Normocephalic, atraumatic, sclera non-icteric, no xanthomas, nares are without discharge.  Neck: JVP significantly elevated with prominent V wave Lungs: Decreased breath sounds at the right lower lobe, intermittent crackles Heart: Tachycardic, regular with S1 S2. No murmurs, rubs, or gallops appreciated. Abdomen: Soft, non-tender, non-distended with normoactive bowel sounds. No hepatomegaly. No rebound/guarding. No obvious abdominal masses. Msk:  Strength and tone appear normal for age. Extremities: No clubbing or cyanosis.  2+ pitting edema of the bilateral lower extreme.  Distal pedal pulses are 2+ and equal bilaterally. Neuro: Alert and oriented X 3. No facial asymmetry. No focal deficit. Moves all extremities spontaneously. Psych:  Responds to questions appropriately with a normal affect.     Assessment and Plan  57 year old lady with widely metastatic small cell lung cancer and large pericardial effusion with signs of tamponade physiology who presents to [facility] for consideration of drainage of pericardial effusion.  This patient is in a very unfortunate situation, and a clear plan regarding her goals of care must be established before determining the best path forward.  In talking to the patient, she seems to be fixated on the hope that drainage of her pericardial effusion will rapidly improve her symptoms, but is unwilling to discuss the bigger picture with her cancer diagnosis.  Would recommend oncology and palliative care consultations in the morning to help frame these  challenging conversations.  While she does have signs of tamponade physiology, her shortness of breath and chest discomfort is likely multifactorial in etiology, with contributions from large lung tumors, possible postobstructive pneumonia, and multiple osseous metastases.  Would strongly urge that the bigger picture be addressed urgently before determining the best procedural approach.  She will need a repeat echocardiogram, mostly to determine feasibility of pericardiocentesis.  Recommend gentle IV fluid hydration given signs of tamponade physiology, however at this point the patient does not have an IV.  Would also recommend opiates and benzodiazepines to improve air hunger, patient comfort and anxiety.  We will follow along with you.  Signed, Doylene Canning, MD 02/07/2018, 3:43 AM

## 2018-02-07 NOTE — Progress Notes (Signed)
   02/07/18 1200  Clinical Encounter Type  Visited With Patient  Visit Type Initial  Referral From Nurse  Responded to page for [facility] consult. Patient was up in bed and nervous about her condition. She believes she can beat the cancer and wants to have the pericardial procedure. I supported her and said spiritual care will be praying for her during [place] on Wednesday and invited her to come at 12 if she felted up to it. Provided emotional and spiritual support.

## 2018-02-07 NOTE — Progress Notes (Addendum)
PROGRESS NOTE                                                                                                                                                                                                             Patient Demographics:    Suzanne Carroll, is a 57 y.o. female, DOB - [birth year], YDX:[id number]  Admit date - 02/06/2018   Admitting Physician Norval Morton, MD  Outpatient Primary MD for the patient is Patient, No Pcp Per  LOS - 1  Outpatient Specialists: None  No chief complaint on file.      Brief Narrative 57 year old female with recently diagnosed metastatic small cell carcinoma to the bone.  She was hospitalized from 12/7-12/29 at [facility] with pathological acute anterior trochanteric fracture of the left hip requiring surgical intervention on 12/8.  During that time a CT angiogram of the chest, abdomen and pelvis showed multiple tumors involving the kidneys, pancreas, liver, lung, right adrenal, peritoneum, retroperitoneum and bone with lumbar vertebral involvement.  Bone biopsy done during surgery showed finding of small cell cancer likely lung primary. Oncology was consulted during hospitalization and offered chemotherapy and radiation however patient refused and wanted to return back with her family in [place] and get worked up.  She was also found to have large pericardial effusion and underwent TEE on 12/12 showing inconsistent pericardial effusion with no signs of RV collapse, compression.  Repeat echo on 12/28 showed large pericardial effusion with elevated intrapericardial pressure and early tamponade physiology.  Patient however refused intervention and was sent to skilled nursing facility. However she started having worsening shortness of breath and pleuritic chest pain radiating to her back on 12/30.  Also complained of leg swelling and shin pain.  She was presented at end of hospital ED where  she was tachycardic in low 100s, tachypneic with stable blood pressure and afebrile.  O2 sat of 98% on 2 L.  Blood work showed mild leukocytosis, hemoglobin of 11.6, platelets of 577, K of 3.3, normal renal function.  CT angiogram of the chest showed extensive tumor in the right lower lobe with bulky thoracic lymphadenopathy, progressive metastases along the right heart border with a large right-sided pericardial effusion and small-to-moderate right pleural effusion, small left pleural effusion, multifocal lytic osseous metastases. Patient transferred to [facility] for cardiology evaluation and pericardial window.  Subjective:   Patient complains of off-and-on chest pain.  Insists on getting pericardial window and treated for her underlying malignancy.   Assessment  & Plan :    Principal Problem:   Malignant pericardial effusion (HCC) Repeat 2D echo shows large pericardial effusion with tamponade physiology.  CVTS consulted by cardiology who reviewed the CT scan which shows total obstruction of the right mainstem bronchus and subtotal obstruction of the left mainstem bronchus.  Dr. Darcey Nora recommended that she would not survive hospitalization due to her terminal illness and that she is not a candidate for pericardial window since once she gets intubated she would be ventilator dependent. Patient wanted to get pericardiocentesis and will be done today. Prognosis is guarded.  Further recommendation per cardiology  Active Problems:    Metastatic small cell lung cancer ([place]) Multiple visceral and osseous metastases as outlined above.  Oncology consulted (will be seen by Dr. Julien Nordmann).  Patient wishes for full scope of treatment.  Prognosis appears guarded.  Patient was not interested in palliative care discussion.  Acute respiratory failure with hypoxia Currently maintaining sats on 2 L.  Continue telemetry monitoring.  Severe protein calorie malnutrition/cachexia Nutrition  consult.  Hypokalemia Replenished.  Code Status : Full code  Family Communication  : None at bedside  Disposition Plan  : Pending hospital course.  Prognosis is guarded  Barriers For Discharge : Active symptoms  Consults  : Cardiology, oncology  Procedures  : 2D echo, pericardiocentesis  DVT Prophylaxis  :  none  Lab Results  Component Value Date   PLT 572 (H) 02/07/2018    Antibiotics  :    Anti-infectives (From admission, onward)   None        Objective:   Vitals:   02/07/18 0413 02/07/18 0811 02/07/18 0812 02/07/18 1158  BP:  110/89  129/88  Pulse: (!) 109 (!) 113    Resp: (!) 23  20 (!) 21  Temp: 98 F (36.7 C)  98.4 F (36.9 C) 98.2 F (36.8 C)  TempSrc: Oral  Oral Oral  SpO2:  96%    Weight:      Height:        Wt Readings from Last 3 Encounters:  02/06/18 46.2 kg  12/07/17 44.5 kg     Intake/Output Summary (Last 24 hours) at 02/07/2018 1519 Last data filed at 02/07/2018 1000 Gross per 24 hour  Intake 428.53 ml  Output -  Net 428.53 ml     Physical Exam  Gen: Middle aged cachectic female in some distress HEENT: Pallor present, temporal wasting,, moist mucosa, supple neck Chest: Diminished bibasilar breath sounds. CVS: S1 and S2 tachycardic, no murmurs rub or gallop GI: soft, NT, ND, BS+ Musculoskeletal: warm, no edema     Data Review:    CBC Recent Labs  Lab 02/07/18 0353  WBC 10.9*  HGB 10.7*  HCT 35.8*  PLT 572*  MCV 88.0  MCH 26.3  MCHC 29.9*  RDW 14.9    Chemistries  Recent Labs  Lab 02/07/18 0353  NA 144  K 3.3*  CL 105  CO2 27  GLUCOSE 105*  BUN 13  CREATININE 0.50  CALCIUM 8.4*   ------------------------------------------------------------------------------------------------------------------ No results for input(s): CHOL, HDL, LDLCALC, TRIG, CHOLHDL, LDLDIRECT in the last 72 hours.  No results found for:  HGBA1C ------------------------------------------------------------------------------------------------------------------ No results for input(s): TSH, T4TOTAL, T3FREE, THYROIDAB in the last 72 hours.  Invalid input(s): FREET3 ------------------------------------------------------------------------------------------------------------------ No results for input(s): VITAMINB12, FOLATE, FERRITIN, TIBC, IRON, RETICCTPCT in the last  72 hours.  Coagulation profile No results for input(s): INR, PROTIME in the last 168 hours.  No results for input(s): DDIMER in the last 72 hours.  Cardiac Enzymes Recent Labs  Lab 02/07/18 0353 02/07/18 0755  TROPONINI <0.03 <0.03   ------------------------------------------------------------------------------------------------------------------ No results found for: BNP  Inpatient Medications  Scheduled Meds: Continuous Infusions: . sodium chloride 75 mL/hr at 02/07/18 0417   PRN Meds:.[MAR Hold] acetaminophen **OR** [MAR Hold] acetaminophen, Heparin (Porcine) in NaCl, [MAR Hold] ipratropium-albuterol, lidocaine (PF), [MAR Hold] LORazepam, [MAR Hold]  morphine injection, [MAR Hold] ondansetron **OR** [MAR Hold] ondansetron (ZOFRAN) IV  Micro Results No results found for this or any previous visit (from the past 240 hour(s)).  Radiology Reports Dg Chest Port 1 View  Result Date: 02/07/2018 CLINICAL DATA:  Cardiac tamponade. Shortness of breath. Small-cell lung cancer. EXAM: PORTABLE CHEST 1 VIEW COMPARISON:  None. FINDINGS: There is marked enlargement of the cardiac silhouette. Pulmonary vascularity is at the upper limits of normal. There are small bilateral pleural effusions, right greater than left. There is slight haziness in both upper lobes most likely representing mild pulmonary edema. Aortic atherosclerosis. No significant bone abnormality. Calcified left breast implant. IMPRESSION: Enlarged cardiac silhouette consistent with pericardial effusion.  Mild pulmonary edema. Small effusions, right greater than left. Electronically Signed   By: Lorriane Shire M.D.   On: 02/07/2018 12:43    Time Spent in minutes  20   Suzanne Carroll M.D on 02/07/2018 at 3:19 PM  Between 7am to 7pm - Pager - [phone]  After 7pm go to [email or web address] - password [facility]  [facility] -  Office  [phone]

## 2018-02-07 NOTE — Progress Notes (Signed)
  Subjective: Patient examined and images of today's echocardiogram and recent CT scan of chest reviewed and counseled with patient.  The patient is a 57 year old cachectic female with advanced age small cell carcinoma lung with severely enlarged central mediastinal and subcarinal adenopathy with total obstruction of the right mainstem bronchus and subtotal obstruction of the left mainstem bronchus.  She does have a large pericardial effusion measuring approximately 2.5 cm in diameter with some RV and RA compression.  Her blood pressure is stable with heart rate 110.  She complains of shortness of breath but her saturation is normal.  The patient has widespread small cell cancer and will probably not survive this hospitalization. The patient would not survive general anesthesia and once intubated would be dependent on ventilator.  I told the patient that I do not feel she would survive or benefit from surgery.  The patient was offered pericardiocentesis at the [facility] and asked about that option.  I have discussed that with Dr. Radford Pax who will talk to an interventional cardiologist who assessed the patient. Objective: Vital signs in last 24 hours: Temp:  [98 F (36.7 C)-98.4 F (36.9 C)] 98.2 F (36.8 C) (12/31 1158) Pulse Rate:  [109-113] 113 (12/31 0811) Cardiac Rhythm: Sinus tachycardia (12/31 0819) Resp:  [20-23] 21 (12/31 1158) BP: (109-129)/(88-93) 129/88 (12/31 1158) SpO2:  [96 %] 96 % (12/31 0811) Weight:  [46.2 kg] 46.2 kg (12/30 2220)  Hemodynamic parameters for last 24 hours:    Intake/Output from previous day: No intake/output data recorded. Intake/Output this shift: Total I/O In: 428.5 [I.V.:428.5] Out: -   Exam Cachectic fragile appearing middle-aged female tachypneic but able to converse and is still mentating appropriately Diminished breath sounds on the right side Positive JVD Sinus tachycardia without friction rub Abdomen distended Lower legs with pitting  edema above the knees  Lab Results: Recent Labs    02/07/18 0353  WBC 10.9*  HGB 10.7*  HCT 35.8*  PLT 572*   BMET:  Recent Labs    02/07/18 0353  NA 144  K 3.3*  CL 105  CO2 27  GLUCOSE 105*  BUN 13  CREATININE 0.50  CALCIUM 8.4*    PT/INR: No results for input(s): LABPROT, INR in the last 72 hours. ABG No results found for: PHART, HCO3, TCO2, ACIDBASEDEF, O2SAT CBG (last 3)  No results for input(s): GLUCAP in the last 72 hours.  Assessment/Plan: S/P  Probable malignant pericardial effusion from advanced age small cell carcinoma.  Patient would not survive general anesthesia and I would not recommend subxiphoid pericardial window.   These issues were discussed directly with the patient.  Tharon Aquas Trigt III 02/07/2018

## 2018-02-07 NOTE — H&P (View-Only) (Signed)
Discussed patient with CVTS.  CT scan reviewed by Dr. Darcey Nora showing total obstruction of the right mainstem bronchus and subtotal obstruction of the left mainstem bronchus.  She has a large pericardial effusion 2.5 cm in diameter with RV and RA compression and respiratory variation her mitral valve inflow and is tachycardic.  Dr. Darcey Nora feels that she would not survive hospitalization and I are in agreement that she has a terminal illness and will likely not survive this hospitalization.  She is not a candidate for pericardial window under general anesthesia because once intubated would be ventilator dependent from there on out.  The patient was offered a pericardiocentesis at [facility] and was then sent here to get 1 done and is expecting to have this done.  I had a very long upfront conversation with the patient.  I have explained to her that she has a terminal illness but she will likely not recover from.  Given that she has significant obstruction of both mainstem bronchi, this is the likely etiology of her shortness of breath and not the pericardial effusion although it is probably contributing some.  We would be willing to do the pericardiocentesis as I reviewed the films with Dr. Daneen Schick.  I did explain to the patient though that this was only a temporizing measure and given the likelihood that this is a malignant effusion will likely reaccumulate in the near future.  She is adamant about proceeding with pericardiocentesis.  I explained the risks of the procedure including right ventricular tear, infection, arrhythmia, cardiac arrest and death.  She understands the risk and is willing to proceed.

## 2018-02-07 NOTE — Interval H&P Note (Signed)
Cath Lab Visit (complete for each Cath Lab visit)  Clinical Evaluation Leading to the Procedure:   ACS: No.  Non-ACS:    Anginal Classification: CCS IV  Anti-ischemic medical therapy: No Therapy  Non-Invasive Test Results: No non-invasive testing performed  Prior CABG: No previous CABG      History and Physical Interval Note:  02/07/2018 1:32 PM  Suzanne Carroll  has presented today for surgery, with the diagnosis of pericardiocentesis  The various methods of treatment have been discussed with the patient and family. After consideration of risks, benefits and other options for treatment, the patient has consented to  Procedure(s): PERICARDIOCENTESIS (N/A) as a surgical intervention .  The patient's history has been reviewed, patient examined, no change in status, stable for surgery.  I have reviewed the patient's chart and labs.  Questions were answered to the patient's satisfaction.     Belva Crome III

## 2018-02-08 DIAGNOSIS — R000 Tachycardia, unspecified: Secondary | ICD-10-CM

## 2018-02-08 DIAGNOSIS — J910 Malignant pleural effusion: Secondary | ICD-10-CM

## 2018-02-08 DIAGNOSIS — Z87891 Personal history of nicotine dependence: Secondary | ICD-10-CM

## 2018-02-08 DIAGNOSIS — R600 Localized edema: Secondary | ICD-10-CM

## 2018-02-08 DIAGNOSIS — Z9889 Other specified postprocedural states: Secondary | ICD-10-CM

## 2018-02-08 DIAGNOSIS — I314 Cardiac tamponade: Secondary | ICD-10-CM

## 2018-02-08 DIAGNOSIS — C7951 Secondary malignant neoplasm of bone: Secondary | ICD-10-CM

## 2018-02-08 DIAGNOSIS — J9601 Acute respiratory failure with hypoxia: Secondary | ICD-10-CM

## 2018-02-08 DIAGNOSIS — R64 Cachexia: Secondary | ICD-10-CM

## 2018-02-08 LAB — CBC
HCT: 35.2 % — ABNORMAL LOW (ref 36.0–46.0)
Hemoglobin: 10.6 g/dL — ABNORMAL LOW (ref 12.0–15.0)
MCH: 26.4 pg (ref 26.0–34.0)
MCHC: 30.1 g/dL (ref 30.0–36.0)
MCV: 87.8 fL (ref 80.0–100.0)
NRBC: 0 % (ref 0.0–0.2)
Platelets: 515 10*3/uL — ABNORMAL HIGH (ref 150–400)
RBC: 4.01 MIL/uL (ref 3.87–5.11)
RDW: 15 % (ref 11.5–15.5)
WBC: 14.8 10*3/uL — ABNORMAL HIGH (ref 4.0–10.5)

## 2018-02-08 LAB — PROTEIN, BODY FLUID (OTHER): TOTAL PROTEIN, BODY FLUID OTHER: 3.7 g/dL

## 2018-02-08 LAB — BASIC METABOLIC PANEL
Anion gap: 9 (ref 5–15)
BUN: 9 mg/dL (ref 6–20)
CALCIUM: 7.9 mg/dL — AB (ref 8.9–10.3)
CO2: 24 mmol/L (ref 22–32)
Chloride: 110 mmol/L (ref 98–111)
Creatinine, Ser: 0.43 mg/dL — ABNORMAL LOW (ref 0.44–1.00)
GFR calc Af Amer: >60 mL/min (ref >60)
GFR calc non Af Amer: >60 mL/min (ref >60)
Glucose, Bld: 122 mg/dL — ABNORMAL HIGH (ref 70–99)
Potassium: 4.2 mmol/L (ref 3.5–5.1)
Sodium: 143 mmol/L (ref 135–145)

## 2018-02-08 LAB — ECHOCARDIOGRAM LIMITED
Height: 61 in
Weight: 1628.8 oz

## 2018-02-08 LAB — LD, BODY FLUID (OTHER): LD, Body Fluid: 452 IU/L

## 2018-02-08 LAB — GLUCOSE, BODY FLUID OTHER: Glucose, Body Fluid Other: 92 mg/dL

## 2018-02-08 LAB — CREATININE, SERUM
Creatinine, Ser: 0.41 mg/dL — ABNORMAL LOW (ref 0.44–1.00)
GFR calc Af Amer: >60 mL/min (ref >60)

## 2018-02-08 NOTE — Progress Notes (Addendum)
Progress Note  Patient Name: Suzanne Carroll Date of Encounter: 02/08/2018  Primary Cardiologist: No primary care provider on file.   Subjective   Underwent pericardiocentesis of 900cc of bloody fluid yesterday. She has drained 55cc since then of bloody fluid. Says her breathing has improved but appears SOB sitting in chair  Inpatient Medications    Scheduled Meds: . heparin  5,000 Units Subcutaneous Q8H  . sodium chloride flush  3 mL Intravenous Q12H   Continuous Infusions: . sodium chloride     PRN Meds: sodium chloride, acetaminophen, ipratropium-albuterol, LORazepam, morphine injection, ondansetron (ZOFRAN) IV, oxyCODONE, sodium chloride flush   Vital Signs    Vitals:   02/08/18 0700 02/08/18 0719 02/08/18 0800 02/08/18 0900  BP: 107/88  (!) 123/97   Pulse: (!) 102  99   Resp: 12  18 17   Temp:  98.4 F (36.9 C)    TempSrc:  Oral    SpO2: 98%  97%   Weight:      Height:        Intake/Output Summary (Last 24 hours) at 02/08/2018 1006 Last data filed at 02/08/2018 0900 Gross per 24 hour  Intake 539.12 ml  Output 55 ml  Net 484.12 ml   Filed Weights   02/06/18 2220  Weight: 46.2 kg    Telemetry    Sinus tachycardia- Personally Reviewed  ECG    No new EKG to review- Personally Reviewed  Physical Exam   GEN: very thin and cachectic ill appearing HEENT: Normal NECK: No JVD; No carotid bruits LYMPHATICS: No lymphadenopathy CARDIAC: regular but tachy, no murmurs, rubs, gallops RESPIRATORY:  Clear to auscultation without rales, wheezing or rhonchi  ABDOMEN: Soft, non-tender, non-distended MUSCULOSKELETAL:  3+ pitting pedal edema; No deformity  SKIN: Warm and dry NEUROLOGIC:  Alert and oriented x 3 PSYCHIATRIC:  Normal affect    Labs    Chemistry Recent Labs  Lab 02/07/18 0353 02/08/18 0051  NA 144  --   K 3.3*  --   CL 105  --   CO2 27  --   GLUCOSE 105*  --   BUN 13  --   CREATININE 0.50 0.41*  CALCIUM 8.4*  --   GFRNONAA >60 >60  GFRAA >60  >60  ANIONGAP 12  --      Hematology Recent Labs  Lab 02/07/18 0353 02/08/18 0051  WBC 10.9* 14.8*  RBC 4.07 4.01  HGB 10.7* 10.6*  HCT 35.8* 35.2*  MCV 88.0 87.8  MCH 26.3 26.4  MCHC 29.9* 30.1  RDW 14.9 15.0  PLT 572* 515*    Cardiac Enzymes Recent Labs  Lab 02/07/18 0353 02/07/18 0755 02/07/18 1639  TROPONINI <0.03 <0.03 0.03*   No results for input(s): TROPIPOC in the last 168 hours.   BNPNo results for input(s): BNP, PROBNP in the last 168 hours.   DDimer No results for input(s): DDIMER in the last 168 hours.   Radiology    Dg Chest Port 1 View  Result Date: 02/07/2018 CLINICAL DATA:  Cardiac tamponade. Shortness of breath. Small-cell lung cancer. EXAM: PORTABLE CHEST 1 VIEW COMPARISON:  None. FINDINGS: There is marked enlargement of the cardiac silhouette. Pulmonary vascularity is at the upper limits of normal. There are small bilateral pleural effusions, right greater than left. There is slight haziness in both upper lobes most likely representing mild pulmonary edema. Aortic atherosclerosis. No significant bone abnormality. Calcified left breast implant. IMPRESSION: Enlarged cardiac silhouette consistent with pericardial effusion. Mild pulmonary edema. Small effusions, right greater  than left. Electronically Signed   By: Lorriane Shire M.D.   On: 02/07/2018 12:43    Cardiac Studies   2D echo 02/07/2018 post pericardiocentesis Study Conclusions  - Left ventricle: Systolic function was vigorous. The estimated   ejection fraction was in the range of 65% to 70%. - Pericardium, extracardiac: A large pericardial effusion was   identified circumferential to the heart. There was right atrial   and right ventricular chamber collapse. Features were consistent   with tamponade physiology.  Impressions:  - Post pericardiocentesis images show resolution of pericardial   effusion.  2D echo 02/07/2018 Study Conclusions  - Left ventricle: The cavity size was  normal. Wall thickness was   normal. Systolic function was normal. The estimated ejection   fraction was in the range of 60% to 65%. Wall motion was normal;   there were no regional wall motion abnormalities. The study is   not technically sufficient to allow evaluation of LV diastolic   function. - Aortic valve: There was trivial regurgitation. - Tricuspid valve: There was mild-moderate regurgitation directed   centrally. - Pulmonary arteries: Systolic pressure was mildly increased. PA   peak pressure: 38 mm Hg (S). - Pericardium, extracardiac: A large, free-flowing pericardial   effusion was identified circumferential to the heart. The fluid   had no internal echoes. There was chamber collapse. Features were   consistent with tamponade physiology.   Patient Profile     58 y.o. 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  Initially refused chemotherapy and was found to have a large pericardial effusion with RV compression and significant mitral inflow variation with respiration but patient declined pericardiocentesis few days ago and wanted to be closer to home.  Readmitted with worsening shortness of breath with chest CTA showing extensive right lower lobe tumor with bulky thoracic lymphadenopathy large pericardial effusion and multifocal opacities with possible pneumonia and osseous metastasis.  She is now transferred here for possible pericardial window.  Assessment & Plan    1.  Large pericardial effusion/tamponade  -Is likely malignant given her widely metastatic lung CA. -CVTS deemed her not a pericardial window candidate due to severe load of Ca with obstruction of right main stem bronchus and partial obstruction of left -Patient is very cachectic and ill-appearing with widely metastatic disease.  Chest CT showed large lung tumors possible postobstructive pneumonia and multiple osseous  metastasis.  I suspect that her pericardial effusion is only part of what is causing her chest discomfort and shortness of breath. -s/p pericariocentesis yesterday removing 900cc of bloody fluid and has drained 55cc since then -repeat limited echo post tap showed resolution of effusion -will keep drain in - fluid will likely rapidly reaccumulate once drain is pulled. -continue to have severe CP likely related to severe pericardial inflammation as well as large tumor burden in chest  2.  Widespread metastatic small cell lung CA -Recommend immediate oncology consult to determine prognosis going forward which I assume is poor -Likely needs palliative care consult  3.  Marked pitting pedal edema -this has accumulated since yesterday -worrisome for IVC obstruction either extrinsically from tumor or thrombosis of IVC from hypercoagulable state  Prognosis is very poor but patient does not want Hospice.  Await Oncology consult.  -this has significantly worsened overnight  I have spent a total of 35 minutes with patient reviewing pericardiocentesis note, pre and post pericardial tap echoes ,  telemetry, EKGs, labs and examining patient as well as establishing an assessment and plan that was discussed with the patient.  > 50% of time was spent in direct patient care.     For questions or updates, please contact [place] Please consult [email or web address] for contact info under Cardiology/STEMI.      Signed, Fransico Him, MD  02/08/2018, 10:06 AM

## 2018-02-08 NOTE — Progress Notes (Addendum)
PROGRESS NOTE                                                                                                                                                                                                             Patient Demographics:    Suzanne Carroll, is a 58 y.o. female, DOB - 26-Feb-1960, DJS:[id number]  Admit date - 02/06/2018   Admitting Physician Norval Morton, MD  Outpatient Primary MD for the patient is Patient, No Pcp Per  LOS - 2  Outpatient Specialists: None  No chief complaint on file.      Brief Narrative 58 year old female with recently diagnosed metastatic small cell carcinoma to the bone.  She was hospitalized from 12/7-12/29 at [facility] with pathological acute anterior trochanteric fracture of the left hip requiring surgical intervention on 12/8.  During that time a CT angiogram of the chest, abdomen and pelvis showed multiple tumors involving the kidneys, pancreas, liver, lung, right adrenal, peritoneum, retroperitoneum and bone with lumbar vertebral involvement.  Bone biopsy done during surgery showed finding of small cell cancer likely lung primary. Oncology was consulted during hospitalization and offered chemotherapy and radiation however patient refused and wanted to return back with her family in [place] and get worked up.  She was also found to have large pericardial effusion and underwent TEE on 12/12 showing inconsistent pericardial effusion with no signs of RV collapse, compression.  Repeat echo on 12/28 showed large pericardial effusion with elevated intrapericardial pressure and early tamponade physiology.  Patient however refused intervention and was sent to skilled nursing facility. However she started having worsening shortness of breath and pleuritic chest pain radiating to her back on 12/30.  Also complained of leg swelling and shin pain.  She was presented at end of hospital ED where  she was tachycardic in low 100s, tachypneic with stable blood pressure and afebrile.  O2 sat of 98% on 2 L.  Blood work showed mild leukocytosis, hemoglobin of 11.6, platelets of 577, K of 3.3, normal renal function.  CT angiogram of the chest showed extensive tumor in the right lower lobe with bulky thoracic lymphadenopathy, progressive metastases along the right heart border with a large right-sided pericardial effusion and small-to-moderate right pleural effusion, small left pleural effusion, multifocal lytic osseous metastases. Patient transferred to [facility] for cardiology evaluation and pericardial window.  Subjective:   Having occasional chest pain but feels her breathing much better after pericardiocentesis.  100 cc removed and fluid sent for study.  Procedure echo shows resolution of tamponade.   Assessment  & Plan :    Principal Problem:   Malignant pericardial effusion (HCC)  2D echo showed large pericardial effusion with tamponade physiology.  CVTS consulted by cardiology who reviewed the CT scan which shows total obstruction of the right mainstem bronchus and subtotal obstruction of the left mainstem bronchus.  Dr. Darcey Nora recommended that she would not survive hospitalization due to her terminal illness and that she is not a candidate for pericardial window since once she gets intubated she would be ventilator dependent. Patient wanted to get pericardiocentesis done and was performed on 12/31 with 900 cc fluid removed.  Tolerated procedure well.  Fluid sent for studies. Monitor in stepdown for today. Prognosis is guarded.  cardiology following.  Active Problems:    Metastatic small cell lung cancer ([place]) Multiple visceral and osseous metastases as outlined above.  Oncology consulted (will be seen by Dr. Julien Nordmann, likely today.).  Patient wishes for full scope of treatment.  Prognosis seems guarded.  Patient is not interested in palliative care discussion.  We will follow-up with  oncology recommendations.  Acute respiratory failure with hypoxia Maintaining sats on 2 L.  Severe protein calorie malnutrition/cachexia Nutrition consult.  Hypokalemia Replenished.  Recent left hip fracture Status post repair about 3 weeks back.  Reports she was ambulating with a walker prior to coming to the hospital.  Obtain PT once hemodynamically more stable.  Code Status : Full code  Family Communication  : None at bedside  Disposition Plan  : Pending hospital course.  Prognosis is guarded  Barriers For Discharge : Active symptoms  Consults  : Cardiology, oncology (pending)  Procedures  : 2D echo, pericardiocentesis  DVT Prophylaxis  :  none  Lab Results  Component Value Date   PLT 515 (H) 02/08/2018    Antibiotics  :    Anti-infectives (From admission, onward)   None        Objective:   Vitals:   02/08/18 0700 02/08/18 0719 02/08/18 0800 02/08/18 0900  BP: 107/88  (!) 123/97   Pulse: (!) 102  99   Resp: 12  18 17   Temp:  98.4 F (36.9 C)    TempSrc:  Oral    SpO2: 98%  97%   Weight:      Height:        Wt Readings from Last 3 Encounters:  02/06/18 46.2 kg  12/07/17 44.5 kg     Intake/Output Summary (Last 24 hours) at 02/08/2018 1048 Last data filed at 02/08/2018 0900 Gross per 24 hour  Intake 539.12 ml  Output 55 ml  Net 484.12 ml   Physical exam Middle-aged cachectic female appears fatigued HEENT: Temporal wasting, moist mucosa, supple neck Chest: Clear breath sounds bilaterally, pericardial drain with scanty bloody output CVS: S1-S2 tachycardic, no murmurs GI: Soft, mild distention, nontender, bowel sounds present Musculoskeletal: Warm, 2+edema       Data Review:    CBC Recent Labs  Lab 02/07/18 0353 02/08/18 0051  WBC 10.9* 14.8*  HGB 10.7* 10.6*  HCT 35.8* 35.2*  PLT 572* 515*  MCV 88.0 87.8  MCH 26.3 26.4  MCHC 29.9* 30.1  RDW 14.9 15.0    Chemistries  Recent Labs  Lab 02/07/18 0353 02/08/18 0051  02/08/18 0915  NA 144  --  143  K 3.3*  --  4.2  CL 105  --  110  CO2 27  --  24  GLUCOSE 105*  --  122*  BUN 13  --  9  CREATININE 0.50 0.41* 0.43*  CALCIUM 8.4*  --  7.9*   ------------------------------------------------------------------------------------------------------------------ No results for input(s): CHOL, HDL, LDLCALC, TRIG, CHOLHDL, LDLDIRECT in the last 72 hours.  No results found for: HGBA1C ------------------------------------------------------------------------------------------------------------------ No results for input(s): TSH, T4TOTAL, T3FREE, THYROIDAB in the last 72 hours.  Invalid input(s): FREET3 ------------------------------------------------------------------------------------------------------------------ No results for input(s): VITAMINB12, FOLATE, FERRITIN, TIBC, IRON, RETICCTPCT in the last 72 hours.  Coagulation profile No results for input(s): INR, PROTIME in the last 168 hours.  No results for input(s): DDIMER in the last 72 hours.  Cardiac Enzymes Recent Labs  Lab 02/07/18 0353 02/07/18 0755 02/07/18 1639  TROPONINI <0.03 <0.03 0.03*   ------------------------------------------------------------------------------------------------------------------ No results found for: BNP  Inpatient Medications  Scheduled Meds: . heparin  5,000 Units Subcutaneous Q8H  . sodium chloride flush  3 mL Intravenous Q12H   Continuous Infusions: . sodium chloride     PRN Meds:.sodium chloride, acetaminophen, ipratropium-albuterol, LORazepam, morphine injection, ondansetron (ZOFRAN) IV, oxyCODONE, sodium chloride flush  Micro Results Recent Results (from the past 240 hour(s))  Gram stain     Status: None   Collection Time: 02/07/18  1:17 PM  Result Value Ref Range Status   Specimen Description PERITONEAL  Final   Special Requests FLUID  Final   Gram Stain   Final    RARE WBC PRESENT, PREDOMINANTLY MONONUCLEAR NO ORGANISMS SEEN Performed at  [facility], [place] [address], [place], [place] [address]    Report Status 02/07/2018 FINAL  Final  MRSA PCR Screening     Status: None   Collection Time: 02/07/18  4:22 PM  Result Value Ref Range Status   MRSA by PCR NEGATIVE NEGATIVE Final    Comment:        The GeneXpert MRSA Assay (FDA approved for NASAL specimens only), is one component of a comprehensive MRSA colonization surveillance program. It is not intended to diagnose MRSA infection nor to guide or monitor treatment for MRSA infections. Performed at [facility], [place] [address], [place],  [address]     Radiology Reports Dg Chest Port 1 View  Result Date: 02/07/2018 CLINICAL DATA:  Cardiac tamponade. Shortness of breath. Small-cell lung cancer. EXAM: PORTABLE CHEST 1 VIEW COMPARISON:  None. FINDINGS: There is marked enlargement of the cardiac silhouette. Pulmonary vascularity is at the upper limits of normal. There are small bilateral pleural effusions, right greater than left. There is slight haziness in both upper lobes most likely representing mild pulmonary edema. Aortic atherosclerosis. No significant bone abnormality. Calcified left breast implant. IMPRESSION: Enlarged cardiac silhouette consistent with pericardial effusion. Mild pulmonary edema. Small effusions, right greater than left. Electronically Signed   By: Lorriane Shire M.D.   On: 02/07/2018 12:43    Time Spent in minutes 35   Verba Ainley M.D on 02/08/2018 at 10:48 AM  Between 7am to 7pm - Pager - [phone]  After 7pm go to [email or web address] - password [email or web address]  [facility] -  Office  [phone]

## 2018-02-08 NOTE — Consult Note (Signed)
[place] [phone]   [phone]  CONSULT NOTE  REFERRING PHYSICIAN: Dr. Flonnie Overman Dhungel  REASON FOR CONSULTATION:  58 years old white female recently diagnosed with lung cancer.  HPI Suzanne Carroll is a 58 y.o. female with long history of smoking and past medical history significant for COPD and arthritis.  The patient lives in [place].  She presented to [facility] complaining of left hip pain for 3 days that was getting worse.  She was found to have pathologic fracture, left intertrochanteric fracture.  She underwent nail fixation of her left hip fracture and during the procedure she had biopsy of the bone.  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  CT scan of the chest, abdomen and pelvis performed on January 14, 2018 at [facility] showed nodular soft tissue mass in the right lower lobe with central hypoattenuation likely representing necrosis and favor primary malignancy versus metastatic disease.  There was also extension of confluent necrotic mass to the right hilum and posterior mediastinum.  The mass encircles the right inferior pulmonary vein with resulting severe narrowing and causes external compression of the right mainstem bronchus, bronchus intermedius and segmental bronchi of the right middle lobe and right lower lobe.  The mass abuts the right pulmonary artery and encircled the right lower lobe segmental and subsegmental pulmonary arteries.  There was a large paratracheal mass likely extension of the previously described mass versus enlarged lymph node.  There was also subcentimeter left pulmonary nodules concerning for sites of contralateral disease.  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  Additional lytic lesions in the ribs with chronic appearing fracture of the posterior left seventh rib likely pathologic.  The patient also has large pericardial effusion.  She was seen by medical oncology during her hospitalization and she was giving the option of treatment with systemic chemotherapy but she declined the treatment at that time and she wanted to be managed close to home. She has been complaining of worsening shortness of breath over the last few days.  She was seen at [facility] and transferred to [facility] for further evaluation.  She was found to have cardiac tamponade and chest x-ray showed market enlargement of the cardiac silhouette.  There was also mild pulmonary edema and small effusions right greater than left.  The patient was not a good candidate for pericardial window because of her general condition.  She was seen by cardiology and she underwent pericardiocentesis under the care of Dr. Tamala Julian yesterday with drainage of greater than 800 cc of bloody fluid.  The pericardial fluid was sent for cytologic evaluation but the results are still pending. The patient's feels much better today with improvement of her shortness of breath.  She denied having any chest pain but has mild cough with no hemoptysis.  She denied having any fever or chills.  She has no nausea, vomiting, diarrhea or constipation.  She is very cachectic Family history significant for father died from lung cancer.  She also has several family members with malignancy.  Her mother is still alive and has Alzheimer's. The patient is divorced.  She has 4 children.  She lives in [place].  She has a history for smoking for over 45  years.  She has no history of alcohol or drug abuse.  HPI  Past Medical History:  Diagnosis Date  . COPD (chronic obstructive pulmonary disease) (HCC)     No past surgical history on file.  No family history on file.  Social History Social History    Tobacco Use  . Smoking status: Former Research scientist (life sciences)  . Smokeless tobacco: Never Used  Substance Use Topics  . Alcohol use: Not Currently    Frequency: Never  . Drug use: Never    Allergies  Allergen Reactions  . Aspirin Hives  . Sulfa Antibiotics Nausea Only and Rash    Current Facility-Administered Medications  Medication Dose Route Frequency Provider Last Rate Last Dose  . 0.9 %  sodium chloride infusion  250 mL Intravenous PRN Belva Crome, MD      . acetaminophen (TYLENOL) tablet 650 mg  650 mg Oral Q4H PRN Belva Crome, MD      . heparin injection 5,000 Units  5,000 Units Subcutaneous Q8H Belva Crome, MD   5,000 Units at 02/08/18 [phone]  . ipratropium-albuterol (DUONEB) 0.5-2.5 (3) MG/3ML nebulizer solution 3 mL  3 mL Nebulization Q4H PRN Belva Crome, MD      . LORazepam (ATIVAN) injection 0.5 mg  0.5 mg Intravenous Q6H PRN Belva Crome, MD   0.5 mg at 02/07/18 1144  . morphine 2 MG/ML injection 2 mg  2 mg Intravenous Q2H PRN Belva Crome, MD   2 mg at 02/08/18 1054  . ondansetron (ZOFRAN) injection 4 mg  4 mg Intravenous Q6H PRN Belva Crome, MD      . oxyCODONE (Oxy IR/ROXICODONE) immediate release tablet 5-10 mg  5-10 mg Oral Q4H PRN Belva Crome, MD   10 mg at 02/08/18 3976  . sodium chloride flush (NS) 0.9 % injection 3 mL  3 mL Intravenous Q12H Belva Crome, MD   3 mL at 02/08/18 0928  . sodium chloride flush (NS) 0.9 % injection 3 mL  3 mL Intravenous PRN Belva Crome, MD        Review of Systems  Constitutional: positive for anorexia, fatigue and weight loss Eyes: negative Ears, nose, mouth, throat, and face: negative Respiratory: positive for dyspnea on exertion Cardiovascular: negative Gastrointestinal: negative Genitourinary:negative Integument/breast: negative Hematologic/lymphatic: negative Musculoskeletal:positive for bone pain Neurological: negative Behavioral/Psych: negative Endocrine: negative Allergic/Immunologic:  negative  Physical Exam  BHA:LPFXT, healthy, no distress, anxious, cachectic, ill looking and malnourished SKIN: skin color, texture, turgor are normal HEAD: Normocephalic, No masses, lesions, tenderness or abnormalities EYES: normal, PERRLA, Conjunctiva are pink and non-injected EARS: External ears normal, Canals clear OROPHARYNX:no exudate, no erythema and lips, buccal mucosa, and tongue normal  NECK: supple, no adenopathy, no JVD LYMPH:  no palpable lymphadenopathy, no hepatosplenomegaly BREAST:not examined LUNGS: decreased breath sounds HEART: regular rate & rhythm and no murmurs ABDOMEN:abdomen soft, non-tender, normal bowel sounds and no masses or organomegaly BACK: No CVA tenderness, Range of motion is normal EXTREMITIES:no joint deformities, effusion, or inflammation, no edema  NEURO: alert & oriented x 3 with fluent speech, no focal motor/sensory deficits  PERFORMANCE STATUS: ECOG 1  LABORATORY DATA: Lab Results  Component Value Date   WBC 14.8 (H) 02/08/2018   HGB 10.6 (L) 02/08/2018   HCT 35.2 (L) 02/08/2018   MCV 87.8 02/08/2018   PLT 515 (H) 02/08/2018    @LASTCHEM @  RADIOGRAPHIC STUDIES: Dg Chest Port 1 View  Result Date: 02/07/2018 CLINICAL DATA:  Cardiac tamponade. Shortness of  breath. Small-cell lung cancer. EXAM: PORTABLE CHEST 1 VIEW COMPARISON:  None. FINDINGS: There is marked enlargement of the cardiac silhouette. Pulmonary vascularity is at the upper limits of normal. There are small bilateral pleural effusions, right greater than left. There is slight haziness in both upper lobes most likely representing mild pulmonary edema. Aortic atherosclerosis. No significant bone abnormality. Calcified left breast implant. IMPRESSION: Enlarged cardiac silhouette consistent with pericardial effusion. Mild pulmonary edema. Small effusions, right greater than left. Electronically Signed   By: Lorriane Shire M.D.   On: 02/07/2018 12:43    ASSESSMENT: This is a very  pleasant 58 years old cachectic white female who was recently diagnosed with extensive stage small cell lung cancer presented with right lower lobe lung mass in addition to right hilar and mediastinal lymphadenopathy as well as bilateral pulmonary nodules malignant pericardial effusion as well as suspicious malignant pleural effusion and bone metastasis diagnosed in December 2019.   PLAN: I had a lengthy discussion with the patient today about her current disease stage, prognosis and treatment options. I explained to the patient that she has incurable condition and all the treatment will be of palliative nature.  I gave the patient the option of palliative care and hospice referral versus consideration of palliative systemic chemotherapy with carboplatin, etoposide and Tecentriq. I discussed with the patient her prognosis with and without treatment.  She understands that her survival is probably less than 3 months without treatment and the average survival with treatment could be up to 9 months depending on her response. The patient mentions that she is interested in considering some form of treatment but she will still need to discuss it with her family and she would like to receive her treatment close to home in [facility]. I gave her the option of treatment in [place] but she felt it would be more convenient for her to receive her treatment close to home especially with lack of transportation and assistant. For the malignant pericardial effusion, the patient underwent pericardiocentesis.  There is always a risk of recurrence for this malignant pericardial effusion unless the patient start treatment for her lung cancer which may decrease the amount of fluid and risk for another tamponade. For the malnutrition the patient may benefit from evaluation by a dietitian during her hospitalization. I strongly encouraged the patient to quit smoking. We will try to arrange for her appointment with  either Dr. Hinton Rao or Dr. Bobby Rumpf in [place] for treatment of her condition if she decided to proceed with chemotherapy.  The patient voices understanding of current disease status and treatment options and is in agreement with the current care plan.  All questions were answered. The patient knows to call the clinic with any problems, questions or concerns. We can certainly see the patient much sooner if necessary.  Thank you so much for allowing me to participate in the care of [facility]. I will continue to follow up the patient with you and assist in her care.   Disclaimer: This note was dictated with voice recognition software. Similar sounding words can inadvertently be transcribed and may not be corrected upon review.   Eilleen Kempf February 08, 2018, 12:36 PM

## 2018-02-09 ENCOUNTER — Encounter: Payer: Self-pay | Admitting: *Deleted

## 2018-02-09 ENCOUNTER — Encounter (HOSPITAL_COMMUNITY): Payer: Self-pay | Admitting: Interventional Cardiology

## 2018-02-09 ENCOUNTER — Inpatient Hospital Stay (HOSPITAL_COMMUNITY): Payer: BLUE CROSS/BLUE SHIELD

## 2018-02-09 DIAGNOSIS — E43 Unspecified severe protein-calorie malnutrition: Secondary | ICD-10-CM

## 2018-02-09 DIAGNOSIS — F411 Generalized anxiety disorder: Secondary | ICD-10-CM

## 2018-02-09 DIAGNOSIS — I314 Cardiac tamponade: Secondary | ICD-10-CM

## 2018-02-09 DIAGNOSIS — Z7189 Other specified counseling: Secondary | ICD-10-CM

## 2018-02-09 DIAGNOSIS — Z515 Encounter for palliative care: Secondary | ICD-10-CM

## 2018-02-09 DIAGNOSIS — Z9889 Other specified postprocedural states: Secondary | ICD-10-CM

## 2018-02-09 DIAGNOSIS — I361 Nonrheumatic tricuspid (valve) insufficiency: Secondary | ICD-10-CM

## 2018-02-09 DIAGNOSIS — G893 Neoplasm related pain (acute) (chronic): Secondary | ICD-10-CM

## 2018-02-09 LAB — CBC
HCT: 36.2 % (ref 36.0–46.0)
Hemoglobin: 11 g/dL — ABNORMAL LOW (ref 12.0–15.0)
MCH: 26.7 pg (ref 26.0–34.0)
MCHC: 30.4 g/dL (ref 30.0–36.0)
MCV: 87.9 fL (ref 80.0–100.0)
Platelets: 457 10*3/uL — ABNORMAL HIGH (ref 150–400)
RBC: 4.12 MIL/uL (ref 3.87–5.11)
RDW: 14.9 % (ref 11.5–15.5)
WBC: 12.5 10*3/uL — ABNORMAL HIGH (ref 4.0–10.5)
nRBC: 0 % (ref 0.0–0.2)

## 2018-02-09 LAB — PH, BODY FLUID: pH, Body Fluid: 7.4

## 2018-02-09 LAB — ECHOCARDIOGRAM LIMITED
HEIGHTINCHES: 61 in
WEIGHTICAEL: 1628.8 [oz_av]

## 2018-02-09 MED ORDER — FUROSEMIDE 10 MG/ML IJ SOLN
40.0000 mg | Freq: Every day | INTRAMUSCULAR | Status: DC
  Administered 2018-02-09 – 2018-02-11 (×3): 40 mg via INTRAVENOUS
  Filled 2018-02-09 (×3): qty 4

## 2018-02-09 MED ORDER — SENNA 8.6 MG PO TABS: 1.0000 | ORAL_TABLET | Freq: Every day | ORAL | Status: DC | PRN

## 2018-02-09 MED ORDER — MORPHINE SULFATE ER 15 MG PO TBCR
15.0000 mg | EXTENDED_RELEASE_TABLET | Freq: Two times a day (BID) | ORAL | Status: DC
  Administered 2018-02-09 – 2018-02-10 (×2): 15 mg via ORAL
  Filled 2018-02-09 (×3): qty 1

## 2018-02-09 NOTE — Progress Notes (Signed)
PROGRESS NOTE                                                                                                                                                                                                             Patient Demographics:    Suzanne Carroll, is a 58 y.o. female, DOB - [birth year], WEX:[id number]  Admit date - 02/06/2018   Admitting Physician Norval Morton, MD  Outpatient Primary MD for the patient is Patient, No Pcp Per  LOS - 3  Outpatient Specialists: None  No chief complaint on file.      Brief Narrative 58 year old female with recently diagnosed metastatic small cell carcinoma to the bone.  She was hospitalized from 12/7-12/29 at [facility] with pathological acute anterior trochanteric fracture of the left hip requiring surgical intervention on 12/8.  During that time a CT angiogram of the chest, abdomen and pelvis showed multiple tumors involving the kidneys, pancreas, liver, lung, right adrenal, peritoneum, retroperitoneum and bone with lumbar vertebral involvement.  Bone biopsy done during surgery showed finding of small cell cancer likely lung primary. Oncology was consulted during hospitalization and offered chemotherapy and radiation however patient refused and wanted to return back with her family in [place] and get worked up.  She was also found to have large pericardial effusion and underwent TEE on 12/12 showing inconsistent pericardial effusion with no signs of RV collapse, compression.  Repeat echo on 12/28 showed large pericardial effusion with elevated intrapericardial pressure and early tamponade physiology.  Patient however refused intervention and was sent to skilled nursing facility. However she started having worsening shortness of breath and pleuritic chest pain radiating to her back on 12/30.  Also complained of leg swelling and shin pain.  She was presented at end of hospital ED where  she was tachycardic in low 100s, tachypneic with stable blood pressure and afebrile.  O2 sat of 98% on 2 L.  Blood work showed mild leukocytosis, hemoglobin of 11.6, platelets of 577, K of 3.3, normal renal function.  CT angiogram of the chest showed extensive tumor in the right lower lobe with bulky thoracic lymphadenopathy, progressive metastases along the right heart border with a large right-sided pericardial effusion and small-to-moderate right pleural effusion, small left pleural effusion, multifocal lytic osseous metastases. Patient transferred to [facility] for cardiology evaluation and pericardial window.  Subjective:   Says her breathing is better today and denies chest pain symptoms.  Tachycardic in low 100s on the monitor. Patient was tearful when I discussed what the oncologist recommended.  She is concerned about her leg swellings.   Assessment  & Plan :    Principal Problem:   Malignant pericardial effusion (HCC)  2D echo showed large pericardial effusion with tamponade physiology.  CVTS consulted by cardiology who reviewed the CT scan which shows total obstruction of the right mainstem bronchus and subtotal obstruction of the left mainstem bronchus.  Dr. Darcey Nora recommended  she is not a candidate for pericardial window since once she gets intubated she would be ventilator dependent. Pericardiocentesis  performed on 12/31 with 900 cc fluid removed.  Tolerated procedure well.  Fluid sent for studies, no growth so far.  Cytology pending. Has about 50 cc output in the drain today.  Now has lower leg edema.  Also has abdominal distention which is new for the past 2 days. We will place on daily IV Lasix. Transfer out to telemetry.  Cardiology following.     Active Problems:    Metastatic small cell lung cancer ([facility]) Multiple visceral and osseous metastases as outlined above.  Oncology consulted.  Dr. Julien Nordmann saw patient on 1/1 and discussed at length with her regarding her  prognosis and treatment options.  She has incurable disease and treatment will only be palliative.  He gave her the option of palliative care with hospice referral versus considering palliative systemic chemotherapy with carboplatin, etoposide and Tecentriq.  Her prognosis without treatment would be <3 months and with treatment the average survival could be up to 9 months depending upon how she responds to the treatment. Patient would like to get her treatment in [place] close to home and Dr. Worthy Flank office will arrange appointment with oncologist in [place]. I have consulted palliative care for goals of care discussion.  Acute respiratory failure with hypoxia Resolved after pericardiocentesis.  Stable on room air.  Severe protein calorie malnutrition/cachexia Nutrition consult pending  Hypokalemia Replenished.  Recent left hip fracture Status post repair about 3 weeks back.  Reports she was ambulating with a walker prior to coming to the hospital.  Obtain PT once pericardial drain removed.  Code Status : Full code  Family Communication  : None at bedside  Disposition Plan  : Pending clinical improvement and possibly discharge to SNF versus home with home health  Barriers For Discharge : Active symptoms  Consults  : Cardiology, oncology   Procedures  : 2D echo, pericardiocentesis  DVT Prophylaxis  : Subcu Lovenox  Lab Results  Component Value Date   PLT 457 (H) 02/09/2018    Antibiotics  :    Anti-infectives (From admission, onward)   None        Objective:   Vitals:   02/09/18 0400 02/09/18 0500 02/09/18 0600 02/09/18 0803  BP: 104/81  111/88   Pulse: (!) 108 (!) 109 (!) 110   Resp: 13 16 14    Temp:    99.3 F (37.4 C)  TempSrc:    Oral  SpO2: 97% 93% 95%   Weight:      Height:        Wt Readings from Last 3 Encounters:  02/06/18 46.2 kg  12/07/17 44.5 kg     Intake/Output Summary (Last 24 hours) at 02/09/2018 0923 Last data filed at 02/09/2018  0600 Gross per 24 hour  Intake 480 ml  Output 600 ml  Net -120  ml   Physical exam Cachectic female in no acute distress, fatigue HEENT: Temporal wasting, moist mucosa, supple neck Chest: Clear bilaterally, pericardial drain with scanty output CVs: S1-S2 tachycardic, no murmurs GI: Abdominal distention, nontender, bowel sounds present Musculoskeletal: Warm, 2+ pitting edema         Data Review:    CBC Recent Labs  Lab 02/07/18 0353 02/08/18 0051 02/09/18 0253  WBC 10.9* 14.8* 12.5*  HGB 10.7* 10.6* 11.0*  HCT 35.8* 35.2* 36.2  PLT 572* 515* 457*  MCV 88.0 87.8 87.9  MCH 26.3 26.4 26.7  MCHC 29.9* 30.1 30.4  RDW 14.9 15.0 14.9    Chemistries  Recent Labs  Lab 02/07/18 0353 02/08/18 0051 02/08/18 0915  NA 144  --  143  K 3.3*  --  4.2  CL 105  --  110  CO2 27  --  24  GLUCOSE 105*  --  122*  BUN 13  --  9  CREATININE 0.50 0.41* 0.43*  CALCIUM 8.4*  --  7.9*   ------------------------------------------------------------------------------------------------------------------ No results for input(s): CHOL, HDL, LDLCALC, TRIG, CHOLHDL, LDLDIRECT in the last 72 hours.  No results found for: HGBA1C ------------------------------------------------------------------------------------------------------------------ No results for input(s): TSH, T4TOTAL, T3FREE, THYROIDAB in the last 72 hours.  Invalid input(s): FREET3 ------------------------------------------------------------------------------------------------------------------ No results for input(s): VITAMINB12, FOLATE, FERRITIN, TIBC, IRON, RETICCTPCT in the last 72 hours.  Coagulation profile No results for input(s): INR, PROTIME in the last 168 hours.  No results for input(s): DDIMER in the last 72 hours.  Cardiac Enzymes Recent Labs  Lab 02/07/18 0353 02/07/18 0755 02/07/18 1639  TROPONINI <0.03 <0.03 0.03*    ------------------------------------------------------------------------------------------------------------------ No results found for: BNP  Inpatient Medications  Scheduled Meds: . furosemide  40 mg Intravenous Daily  . heparin  5,000 Units Subcutaneous Q8H  . sodium chloride flush  3 mL Intravenous Q12H   Continuous Infusions: . sodium chloride     PRN Meds:.sodium chloride, acetaminophen, ipratropium-albuterol, LORazepam, morphine injection, ondansetron (ZOFRAN) IV, oxyCODONE, sodium chloride flush  Micro Results Recent Results (from the past 240 hour(s))  Gram stain     Status: None   Collection Time: 02/07/18  1:17 PM  Result Value Ref Range Status   Specimen Description PERITONEAL  Final   Special Requests FLUID  Final   Gram Stain   Final    RARE WBC PRESENT, PREDOMINANTLY MONONUCLEAR NO ORGANISMS SEEN Performed at [facility], [place] [address], [place], [place] [address]    Report Status 02/07/2018 FINAL  Final  Culture, body fluid-bottle     Status: None (Preliminary result)   Collection Time: 02/07/18  3:17 PM  Result Value Ref Range Status   Specimen Description PERITONEAL  Final   Special Requests NONE  Final   Culture   Final    NO GROWTH < 24 HOURS Performed at [facility], [place] [address], [place], [place] [address]    Report Status PENDING  Incomplete  MRSA PCR Screening     Status: None   Collection Time: 02/07/18  4:22 PM  Result Value Ref Range Status   MRSA by PCR NEGATIVE NEGATIVE Final    Comment:        The GeneXpert MRSA Assay (FDA approved for NASAL specimens only), is one component of a comprehensive MRSA colonization surveillance program. It is not intended to diagnose MRSA infection nor to guide or monitor treatment for MRSA infections. Performed at [facility], [place] [address] [place], [place] [address]  Radiology Reports Dg Chest Port 1 View  Result Date: 02/07/2018 CLINICAL DATA:  Cardiac tamponade.  Shortness of breath. Small-cell lung cancer. EXAM: PORTABLE CHEST 1 VIEW COMPARISON:  None. FINDINGS: There is marked enlargement of the cardiac silhouette. Pulmonary vascularity is at the upper limits of normal. There are small bilateral pleural effusions, right greater than left. There is slight haziness in both upper lobes most likely representing mild pulmonary edema. Aortic atherosclerosis. No significant bone abnormality. Calcified left breast implant. IMPRESSION: Enlarged cardiac silhouette consistent with pericardial effusion. Mild pulmonary edema. Small effusions, right greater than left. Electronically Signed   By: Lorriane Shire M.D.   On: 02/07/2018 12:43    Time Spent in minutes 25   Darrien Laakso M.D on 02/09/2018 at 9:23 AM  Between 7am to 7pm - Pager - [phone]  After 7pm go to [email or web address] - password [facility]  [facility] -  Office  [phone]

## 2018-02-09 NOTE — Progress Notes (Signed)
  Echocardiogram 2D Echocardiogram has been performed.  Suzanne Carroll 02/09/2018, 3:14 PM

## 2018-02-09 NOTE — Progress Notes (Signed)
Progress Note  Patient Name: Suzanne Carroll Date of Encounter: 02/09/2018  Primary Cardiologist: No primary care provider on file.   Subjective   Pericardial catheter still draining.  125cc in the past 24 hours.  Complains of SOB and pleuritic CP  Inpatient Medications    Scheduled Meds: . furosemide  40 mg Intravenous Daily  . heparin  5,000 Units Subcutaneous Q8H  . sodium chloride flush  3 mL Intravenous Q12H   Continuous Infusions: . sodium chloride     PRN Meds: sodium chloride, acetaminophen, ipratropium-albuterol, LORazepam, morphine injection, ondansetron (ZOFRAN) IV, oxyCODONE, sodium chloride flush   Vital Signs    Vitals:   02/09/18 1000 02/09/18 1100 02/09/18 1116 02/09/18 1125  BP: (!) 118/92  108/85   Pulse: (!) 102 (!) 111 (!) 118   Resp: 13 (!) 35 (!) 24   Temp:    98 F (36.7 C)  TempSrc:    Oral  SpO2: 97% 95% 96%   Weight:      Height:        Intake/Output Summary (Last 24 hours) at 02/09/2018 1220 Last data filed at 02/09/2018 1100 Gross per 24 hour  Intake 360 ml  Output 1085 ml  Net -725 ml   Filed Weights   02/06/18 2220  Weight: 46.2 kg    Telemetry    Sinus tachycardia- Personally Reviewed  ECG    No new EKG to review- Personally Reviewed  Physical Exam   GEN: Thin and cachectic, appears SOB HEENT: Normal NECK: No JVD; No carotid bruits LYMPHATICS: No lymphadenopathy CARDIAC:RRR, no murmurs, rubs, gallops RESPIRATORY:  Clear to auscultation without rales, wheezing or rhonchi  ABDOMEN: Soft, non-tender, non-distended MUSCULOSKELETAL:  Pitting pedal edema; No deformity  SKIN: Warm and dry NEUROLOGIC:  Alert and oriented x 3 PSYCHIATRIC:  Normal affect    Labs    Chemistry Recent Labs  Lab 02/07/18 0353 02/08/18 0051 02/08/18 0915  NA 144  --  143  K 3.3*  --  4.2  CL 105  --  110  CO2 27  --  24  GLUCOSE 105*  --  122*  BUN 13  --  9  CREATININE 0.50 0.41* 0.43*  CALCIUM 8.4*  --  7.9*  GFRNONAA >60 >60 >60    GFRAA >60 >60 >60  ANIONGAP 12  --  9     Hematology Recent Labs  Lab 02/07/18 0353 02/08/18 0051 02/09/18 0253  WBC 10.9* 14.8* 12.5*  RBC 4.07 4.01 4.12  HGB 10.7* 10.6* 11.0*  HCT 35.8* 35.2* 36.2  MCV 88.0 87.8 87.9  MCH 26.3 26.4 26.7  MCHC 29.9* 30.1 30.4  RDW 14.9 15.0 14.9  PLT 572* 515* 457*    Cardiac Enzymes Recent Labs  Lab 02/07/18 0353 02/07/18 0755 02/07/18 1639  TROPONINI <0.03 <0.03 0.03*   No results for input(s): TROPIPOC in the last 168 hours.   BNPNo results for input(s): BNP, PROBNP in the last 168 hours.   DDimer No results for input(s): DDIMER in the last 168 hours.   Radiology    Dg Chest Port 1 View  Result Date: 02/07/2018 CLINICAL DATA:  Cardiac tamponade. Shortness of breath. Small-cell lung cancer. EXAM: PORTABLE CHEST 1 VIEW COMPARISON:  None. FINDINGS: There is marked enlargement of the cardiac silhouette. Pulmonary vascularity is at the upper limits of normal. There are small bilateral pleural effusions, right greater than left. There is slight haziness in both upper lobes most likely representing mild pulmonary edema. Aortic atherosclerosis. No  significant bone abnormality. Calcified left breast implant. IMPRESSION: Enlarged cardiac silhouette consistent with pericardial effusion. Mild pulmonary edema. Small effusions, right greater than left. Electronically Signed   By: Lorriane Shire M.D.   On: 02/07/2018 12:43    Cardiac Studies   2D echo 02/07/2018 post pericardiocentesis Study Conclusions  - Left ventricle: Systolic function was vigorous. The estimated   ejection fraction was in the range of 65% to 70%. - Pericardium, extracardiac: A large pericardial effusion was   identified circumferential to the heart. There was right atrial   and right ventricular chamber collapse. Features were consistent   with tamponade physiology.  Impressions:  - Post pericardiocentesis images show resolution of pericardial    effusion.  2D echo 02/07/2018 Study Conclusions  - Left ventricle: The cavity size was normal. Wall thickness was   normal. Systolic function was normal. The estimated ejection   fraction was in the range of 60% to 65%. Wall motion was normal;   there were no regional wall motion abnormalities. The study is   not technically sufficient to allow evaluation of LV diastolic   function. - Aortic valve: There was trivial regurgitation. - Tricuspid valve: There was mild-moderate regurgitation directed   centrally. - Pulmonary arteries: Systolic pressure was mildly increased. PA   peak pressure: 38 mm Hg (S). - Pericardium, extracardiac: A large, free-flowing pericardial   effusion was identified circumferential to the heart. The fluid   had no internal echoes. There was chamber collapse. Features were   consistent with tamponade physiology.   Patient Profile     58 y.o. 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  Initially refused chemotherapy and was found to have a large pericardial effusion with RV compression and significant mitral inflow variation with respiration but patient declined pericardiocentesis few days ago and wanted to be closer to home.  Readmitted with worsening shortness of breath with chest CTA showing extensive right lower lobe tumor with bulky thoracic lymphadenopathy large pericardial effusion and multifocal opacities with possible pneumonia and osseous metastasis.  She is now transferred here for possible pericardial window.  Assessment & Plan    1.  Large pericardial effusion/tamponade  -Is likely malignant given her widely metastatic lung CA. -CVTS deemed her not a pericardial window candidate due to severe load of Ca with obstruction of right main stem bronchus and partial obstruction of left -Patient is very cachectic and ill-appearing with widely metastatic disease.   Chest CT showed large lung tumors possible postobstructive pneumonia and multiple osseous metastasis.  I suspect that her pericardial effusion is only part of what is causing her chest discomfort and shortness of breath. -s/p pericariocentesis yesterday removing 900cc of bloody fluid  -drained 125cc yesterday -repeat limited echo today to check for residual.  If no residual effusion then consider removing cathter.  I explained to her today that this will likely reaccumulate -continue to have severe CP likely related to severe pericardial inflammation as well as large tumor burden in chest  2.  Widespread metastatic small cell lung CA -Recommend immediate oncology consult to determine prognosis going forward which I assume is poor -Palliative care consult pending although she tells me she is not interested and she is considering going somewhere else for a second opionin  3.  Marked pitting pedal edema -worrisome for IVC obstruction either extrinsically from tumor or thrombosis of IVC from hypercoagulable state -started on IV lasix  Prognosis is very poor but patient does not want Hospice.    For questions or updates, please contact [place] Please consult [email or web address] for contact info under Cardiology/STEMI.      Signed, Fransico Him, MD  02/09/2018, 12:20 PM

## 2018-02-09 NOTE — Progress Notes (Signed)
Initial Nutrition Assessment  DOCUMENTATION CODES:   Severe malnutrition in context of chronic illness  INTERVENTION:    Liberalize diet to Regular  Verbal with read back order received per Dr. Clementeen Graham  NUTRITION DIAGNOSIS:   Severe Malnutrition related to chronic illness(malignant pericardial effusion, metastatic lung cancer) as evidenced by severe fat depletion, severe muscle depletion  GOAL:   Patient will meet greater than or equal to 90% of their needs  MONITOR:   PO intake, Labs, Skin, Weight trends, I & O's  REASON FOR ASSESSMENT:   Consult Assessment of nutrition requirement/status  ASSESSMENT:   58 yo Female with PMH of COPD; started having worsening shortness of breath and pleuritic chest pain radiating to her back; CT angiogram of the chest showed extensive tumor in the right lower lobe; progressive metastases along the right heart border with a large right-sided pericardial effusion and small-to-moderate right pleural effusion, small left pleural effusion, multifocal lytic osseous metastases.  RD spoke with pt at bedside. Pt states she vomited her breakfast bc it tasted so bad. She typically consumes 1-2 meals per day bc "I'm on the go, working 12 hours"  Reveals she's been losing weight over the last few years; unable to specify amount. RD offered several nutrition supplements available here. She declined all supplement options.  Labs & medications reviewed.  NUTRITION - FOCUSED PHYSICAL EXAM:    Most Recent Value  Orbital Region  Moderate depletion  Upper Arm Region  Severe depletion  Thoracic and Lumbar Region  Unable to assess  Buccal Region  Moderate depletion  Temple Region  Moderate depletion  Clavicle Bone Region  Severe depletion  Clavicle and Acromion Bone Region  Severe depletion  Scapular Bone Region  Unable to assess  Dorsal Hand  Severe depletion  Patellar Region  Severe depletion  Anterior Thigh Region  Severe depletion  Posterior  Calf Region  Severe depletion  Edema (RD Assessment)  None     Diet Order:   Diet Order            Diet regular Room service appropriate? Yes; Fluid consistency: Thin  Diet effective now             EDUCATION NEEDS:   No education needs have been identified at this time  Skin:  Skin Assessment: Reviewed RN Assessment  Last BM:  12/31  Height:   Ht Readings from Last 1 Encounters:  02/06/18 5\' 1"  (1.549 m)   Weight:   Wt Readings from Last 1 Encounters:  02/06/18 46.2 kg   BMI:  Body mass index is 19.23 kg/m.  Estimated Nutritional Needs:   Kcal:  1600-1800  Protein:  70-85 gm  Fluid:  per MD  Arthur Holms, RD, LDN Pager #: [phone] After-Hours Pager #: [phone]

## 2018-02-09 NOTE — Progress Notes (Signed)
I responded to a [place] to provide spiritual support for the patient. I visited the patient's room and listened with compassion as she spoke about her health status. I shared words of encouragement and led in prayer. I share that the Chaplain is available to provide additional support as needed or requested.     02/09/18 1634  Clinical Encounter Type  Visited With Patient  Visit Type Spiritual support  Referral From Nurse  Consult/Referral To Chaplain  Spiritual Encounters  Spiritual Needs Prayer    Chaplain Dr Redgie Grayer

## 2018-02-09 NOTE — Progress Notes (Signed)
Oncology Nurse Navigator Documentation  Oncology Nurse Navigator Flowsheets 02/09/2018  Navigator Location [facility]  Referral date to RadOnc/MedOnc 02/09/2018  Navigator Encounter Type Other/per Dr. Julien Nordmann, patient would like to be seen in [place].  I completed new patient referral on line for [facility] as well as fax notes to them.  My name and phone number is on faxed information if needed to get in touch with me.   Treatment Phase Pre-Tx/Tx Discussion  Barriers/Navigation Needs Coordination of Care  Interventions Coordination of Care  Coordination of Care Other  Acuity Level 2  Time Spent with Patient 60

## 2018-02-09 NOTE — Consult Note (Signed)
Consultation Note Date: 02/09/2018   Patient Name: Suzanne Carroll  DOB: 06-Feb-1961  MRN: [id number]  Age / Sex: 58 y.o., female  PCP: Patient, No Pcp Per Referring Physician: Louellen Molder, MD  Reason for Consultation: Establishing goals of care  HPI/Patient Profile: 58 y.o. female  with past medical history of COPD, smoker admitted on 02/06/2018 with shortness of breath and pleuritic chest pain radiating to back. Patient hospitalized at [facility] 12/7-12/29 with pathologic acute anterior trochanteric fracture of left hip requiring surgical intervention on 12/8. CT scans revealed multiple tumors involving kidneys, pancrease, liver, lung, right adrenal, peritoneum, retroperitoneum and bone with lumbar vertebral involvement. Biopsy revealed small cell cancer likely primary lung. Also found to have large pericardial effusion and underwent TEE on 12/12 with no signs of RV collapse or compression. Patient discharged to SNF but readmitted for above symptoms. CT angio chest shows extensive tumor in right lower lobe with bulky thoracic lymphadenopathy, progressive metastases along right hear border with large right-sided pericardial effusion, bilateral pleural effusions, and multifocal lytic osseous metastases. Repeat ECHO 12/28 revealed large pericardial effusion with elevated intrapericardial pressure/early tamponade physiology. Patient not a candidate for pericardial window. S/p pericardiocentesis 12/31 with 900 cc removed. Oncology consulted and has discussed palliative treatment options and prognosis with patient. Palliative medicine consultation for goals of care.   Clinical Assessment and Goals of Care:  I have reviewed medical records, discussed with care team, and met with patient at bedside to discuss diagnosis prognosis, GOC, EOL and wishes.  Introduced Palliative Medicine as specialized medical care for people  living with serious illness. It focuses on providing relief from the symptoms and stress of a serious illness. The goal is to improve quality of life for both the patient and the family.  We discussed a brief life review of the patient. She is divorced. Three adult children who do not sound involved in her care. Family living in [place]. She has lived and worked at a [employer] in [place] for about five years. Attempted to explore her support system and hobbies/interests. She speaks of having "no one" and no hobbies/interests due to finances. She works 12 hour shifts multiple days a week and only has enough money to pay her bills. Therapeutic listening and emotional support provided.   First discussed her symptom management. Hx of neck pain prescribed oxycodone BID outpatient. She shares that this medication provides minimal relief from pain. She is now complaining of generalized, constant pain. Morphine 1m IV has been helping but does not last longer than a few hours. Multiple prn doses in the last 24 hours (151mIV Morphine). Discussed last BM, which was today. Per patient, history of colitis and loose stools.   [facility]. Patient becomes tearful and anxious. She immediately tells me she will "fight" this and that "miracles happen every day." She further explains that doctors just expect her to lay here and die but she is determined to "fight."   She contradicts herself and shares that  she feels she is forced to do chemotherapy. I further explained two options moving forward: oncology f/u with treatment options (palliative in nature) versus focus on comfort, quality, and symptom management. Explained that she is cognizant and this is HER decision to make if she wishes to pursue treatment or not. Also explained that it is our job as medical professions to be honest regarding diagnoses, prognosis, and options.   Suzanne Carroll further explains that she has  been doing Mudlogger" about homeopathic options. Further explained her concern that insurance wouldn't pay for these options and she does not have financial resources to pay. I encouraged her to write down all of her questions/concerns throughout her 'research' and ask care team/oncologist at initial meeting. Patient questions how cancer has spread throughout her body and she was unaware. Explained aggressive nature of her cancer diagnosis.     Attempted to discussed advanced directives and importance of her considering a trusting decision maker if she was unable to make decisions for herself. Suzanne Carroll has paperwork at home but has not completed paperwork because she is unsure who to appoint as HCPOA. Encouraged her to consider and document HCPOA with unfortunate big decisions she is faced with in the future. During this part of conversation, patient becomes more anxious because she was incontinent in the bed. Asking for nursing staff to clean her and allow her to sit in bedside commode.   PMT contact information given. Emotional support provided.    SUMMARY OF RECOMMENDATIONS    Initial discussion with patient at bedside. Patient is determined to "fight" and pursue oncology options. She is still processing new diagnosis of metastatic cancer and poor prognosis. She states "miracles happen every day."  Oncology following. Patient lives in [place]. [facility] referral placed.   Attempted to discuss importance of advanced directives. Patient still considering who she will appoint as [place]. Family not local and minimally involved in her care. Encouraged patient to consider completing advanced directives and documenting HCPOA.   Patient will need ongoing palliative follow-up with metastatic lung cancer, high symptom burden, and very poor prognosis. High risk for decline/decompensation.  Code Status/Advance Care Planning:  Full code  Symptom Management:   MS Contin 49m PO BID  Morphine  251mIV q2h prn pain/dyspnea  Senna 1tab daily prn (Patient tells me she will refuse taking this scheduled with history of colitis/frequent diarrhea).   Ativan 0.54m41mV q6h prn anxiety  Palliative Prophylaxis:   Aspiration, Bowel Regimen, Frequent Pain Assessment, Oral Care and Turn Reposition  Additional Recommendations (Limitations, Scope, Preferences):  Full Scope Treatment  Psycho-social/Spiritual:   Desire for further Chaplaincy support:yes  Additional Recommendations: Caregiving  Support/Resources, Compassionate Wean Education and Education on Hospice  Prognosis:   Poor prognosis with newly diagnosed small cell lung cancer with multiple areas of metastases, malignant pericardial effusion, severe protein calorie malnutrition/cachexia, and declining functional/nutritional status.  Discharge Planning: To Be Determined      Primary Diagnoses: Present on Admission: . SOB (shortness of breath) . Malignant pericardial effusion ([facility] Small cell lung cancer ([facility] I have reviewed the medical record, interviewed the patient and family, and examined the patient. The following aspects are pertinent.  Past Medical History:  Diagnosis Date  . COPD (chronic obstructive pulmonary disease) (HCC)    Social History   Socioeconomic History  . Marital status: Single    Spouse name: Not on file  . Number of children: Not on file  . Years of education: Not on file  .  Highest education level: Not on file  Occupational History  . Not on file  Social Needs  . Financial resource strain: Not on file  . Food insecurity:    Worry: Not on file    Inability: Not on file  . Transportation needs:    Medical: Not on file    Non-medical: Not on file  Tobacco Use  . Smoking status: Former Research scientist (life sciences)  . Smokeless tobacco: Never Used  Substance and Sexual Activity  . Alcohol use: Not Currently    Frequency: Never  . Drug use: Never  . Sexual activity: Not on file  Lifestyle  .  Physical activity:    Days per week: Not on file    Minutes per session: Not on file  . Stress: Not on file  Relationships  . Social connections:    Talks on phone: Not on file    Gets together: Not on file    Attends religious service: Not on file    Active member of club or organization: Not on file    Attends meetings of clubs or organizations: Not on file    Relationship status: Not on file  Other Topics Concern  . Not on file  Social History Narrative  . Not on file   No family history on file. Scheduled Meds: . furosemide  40 mg Intravenous Daily  . heparin  5,000 Units Subcutaneous Q8H  . sodium chloride flush  3 mL Intravenous Q12H   Continuous Infusions: . sodium chloride     PRN Meds:.sodium chloride, acetaminophen, ipratropium-albuterol, LORazepam, morphine injection, ondansetron (ZOFRAN) IV, oxyCODONE, sodium chloride flush Medications Prior to Admission:  Prior to Admission medications   Medication Sig Start Date End Date Taking? Authorizing Provider  oxyCODONE-acetaminophen (PERCOCET) 10-325 MG tablet Take 1 tablet by mouth 2 (two) times daily.    [provider]  zolpidem (AMBIEN) 10 MG tablet Take 10 mg by mouth at bedtime as needed for sleep.    [provider]   Allergies  Allergen Reactions  . Aspirin Hives  . Sulfa Antibiotics Nausea Only and Rash   Review of Systems  Constitutional: Positive for activity change and appetite change.  Respiratory: Positive for shortness of breath.   Neurological: Positive for weakness.   Physical Exam Vitals signs and nursing note reviewed.  Constitutional:      General: She is awake.     Appearance: She is cachectic. She is ill-appearing.  HENT:     Head: Normocephalic and atraumatic.  Cardiovascular:     Rate and Rhythm: Tachycardia present.  Pulmonary:     Effort: No tachypnea, accessory muscle usage or respiratory distress.  Abdominal:     Tenderness: There is no abdominal tenderness.    Skin:    General: Skin is warm and dry.  Neurological:     Mental Status: She is alert and oriented to person, place, and time.  Psychiatric:        Attention and Perception: Attention normal.        Mood and Affect: Mood normal.        Speech: Speech normal.        Cognition and Memory: Cognition normal.    Vital Signs: BP 118/90 (BP Location: Left Arm)   Pulse (!) 115   Temp 98 F (36.7 C) (Oral)   Resp (!) 24   Ht _0  (1.549 m)   Wt 46.2 kg   SpO2 96%   BMI 19.23 kg/m  Pain Scale: 0-10 POSS *  See Group Information*: S-Acceptable,Sleep, easy to arouse Pain Score: 0-No pain   SpO2: SpO2: 96 % O2 Device:SpO2: 96 % O2 Flow Rate: .O2 Flow Rate (L/min): 2 L/min  IO: Intake/output summary:   Intake/Output Summary (Last 24 hours) at 02/09/2018 1244 Last data filed at 02/09/2018 1100 Gross per 24 hour  Intake 360 ml  Output 1085 ml  Net -725 ml    LBM: Last BM Date: 02/07/18 Baseline Weight: Weight: 46.2 kg Most recent weight: Weight: 46.2 kg     Palliative Assessment/Data: PPS 40%   Flowsheet Rows     Most Recent Value  Intake Tab  Referral Department  Hospitalist  Unit at Time of Referral  ICU  Palliative Care Primary Diagnosis  Cancer  Palliative Care Type  New Palliative care  Reason for referral  Clarify Goals of Care  Date first seen by Palliative Care  02/09/18  Clinical Assessment  Palliative Performance Scale Score  40%  Psychosocial & Spiritual Assessment  Palliative Care Outcomes  Patient/Family meeting held?  Yes  Who was at the meeting?  patient  Palliative Care Outcomes  Clarified goals of care, Improved pain interventions, Improved non-pain symptom therapy, Provided end of life care assistance, Provided psychosocial or spiritual support, Linked to palliative care logitudinal support, ACP counseling assistance      Time In: 1000 Time Out: 1110 Time Total: 70 Greater than 50%  of this time was spent counseling and coordinating care related to  the above assessment and plan.  Signed by:  Ihor Dow, FNP-C Palliative Medicine Team  Phone: [phone] Fax: [phone]   Please contact Palliative Medicine Team phone at [phone] for questions and concerns.  For individual provider: See Shea Evans

## 2018-02-10 DIAGNOSIS — Z515 Encounter for palliative care: Secondary | ICD-10-CM

## 2018-02-10 DIAGNOSIS — E43 Unspecified severe protein-calorie malnutrition: Secondary | ICD-10-CM

## 2018-02-10 LAB — CBC WITH DIFFERENTIAL/PLATELET
Abs Immature Granulocytes: 0.04 10*3/uL (ref 0.00–0.07)
BASOS PCT: 1 %
Basophils Absolute: 0.1 10*3/uL (ref 0.0–0.1)
Eosinophils Absolute: 0.2 10*3/uL (ref 0.0–0.5)
Eosinophils Relative: 2 %
HCT: 38.1 % (ref 36.0–46.0)
Hemoglobin: 11.5 g/dL — ABNORMAL LOW (ref 12.0–15.0)
Immature Granulocytes: 0 %
Lymphocytes Relative: 9 %
Lymphs Abs: 1 10*3/uL (ref 0.7–4.0)
MCH: 26.3 pg (ref 26.0–34.0)
MCHC: 30.2 g/dL (ref 30.0–36.0)
MCV: 87.2 fL (ref 80.0–100.0)
Monocytes Absolute: 0.7 10*3/uL (ref 0.1–1.0)
Monocytes Relative: 7 %
Neutro Abs: 8.8 10*3/uL — ABNORMAL HIGH (ref 1.7–7.7)
Neutrophils Relative %: 81 %
PLATELETS: 451 10*3/uL — AB (ref 150–400)
RBC: 4.37 MIL/uL (ref 3.87–5.11)
RDW: 15 % (ref 11.5–15.5)
WBC: 10.9 10*3/uL — ABNORMAL HIGH (ref 4.0–10.5)
nRBC: 0 % (ref 0.0–0.2)

## 2018-02-10 LAB — MAGNESIUM: Magnesium: 1.8 mg/dL (ref 1.7–2.4)

## 2018-02-10 LAB — BASIC METABOLIC PANEL
Anion gap: 9 (ref 5–15)
BUN: 10 mg/dL (ref 6–20)
CO2: 25 mmol/L (ref 22–32)
CREATININE: 0.41 mg/dL — AB (ref 0.44–1.00)
Calcium: 7.7 mg/dL — ABNORMAL LOW (ref 8.9–10.3)
Chloride: 106 mmol/L (ref 98–111)
GFR calc Af Amer: >60 mL/min (ref >60)
GFR calc non Af Amer: >60 mL/min (ref >60)
Glucose, Bld: 106 mg/dL — ABNORMAL HIGH (ref 70–99)
Potassium: 3.8 mmol/L (ref 3.5–5.1)
Sodium: 140 mmol/L (ref 135–145)

## 2018-02-10 LAB — PHOSPHORUS: Phosphorus: 2.1 mg/dL — ABNORMAL LOW (ref 2.5–4.6)

## 2018-02-10 MED ORDER — MORPHINE SULFATE ER 15 MG PO TBCR
15.0000 mg | EXTENDED_RELEASE_TABLET | Freq: Three times a day (TID) | ORAL | Status: DC
  Administered 2018-02-10 – 2018-02-15 (×14): 15 mg via ORAL
  Filled 2018-02-10 (×14): qty 1

## 2018-02-10 MED ORDER — OXYCODONE HCL 5 MG PO TABS: 5.0000 mg | ORAL_TABLET | Freq: Four times a day (QID) | ORAL | Status: DC | PRN

## 2018-02-10 NOTE — Progress Notes (Signed)
   02/10/18 1017  Clinical Encounter Type  Visited With Patient  Visit Type Initial  Referral From Palliative care team  Consult/Referral To Chaplain  The chaplain responded PMT request for Pt. spiritual care.  The Pt. welcomed the chaplain into the room for conversation.  The Pt. dialogue quickly raised to an anxious level when talking about her extended care needs at the hospital and cancer diagnosis.  The Pt. concerns focused on her relationship with her employer[employer] in [place].  The Pt. communicated with the chaplain the SW in the [facility] healthcare system started communication with her employer.  The chaplain offered to join the Pt. in a phone call to [facility] or [facility].  The Pt. is set on the [facility] SW making the call.  The RN-Olivia assisted in noting a consult to SW was made Thursday at 11am.  The chaplain introduced the possibility of talking to friends or family.  The Pt. shared she talks to a couple of friends in [place] daily on the phone.  The Pt. accepted future spiritual care from the chaplain. The chaplain left the Pt. with a more relaxed mood.

## 2018-02-10 NOTE — Progress Notes (Signed)
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  She was hospitalized from 12/7-12/29 at [facility] with pathological acute anterior trochanteric fracture of the Suzanne hip requiring surgical intervention on 12/8.  During that time a CT angiogram of the chest, abdomen and pelvis showed multiple tumors involving the kidneys, pancreas, liver, lung, Suzanne adrenal, peritoneum, retroperitoneum and bone with lumbar vertebral involvement.  Bone biopsy done during surgery showed finding of small cell cancer likely lung primary. Oncology was consulted during hospitalization and offered chemotherapy and radiation however patient refused and wanted to return back with her family in [place] and get worked up.  She was also found to have large pericardial effusion and underwent TEE on 12/12 showing inconsistent pericardial effusion with no signs of RV collapse, compression.  Repeat echo on 12/28 showed large pericardial effusion with elevated intrapericardial pressure and early tamponade physiology.  Patient however refused intervention and was sent to skilled nursing facility. However she started having worsening shortness of breath and pleuritic chest pain radiating to her back on 12/30.  Also complained of leg swelling and shin pain.  She was presented at end of hospital ED where she was tachycardic in low 100s, tachypneic with stable blood pressure and afebrile.  O2 sat of 98% on 2 L.  Blood work showed mild leukocytosis, hemoglobin of 11.6, platelets of 577, K of 3.3, normal renal function.  CT angiogram of the chest showed extensive tumor in the Suzanne lower lobe with bulky thoracic lymphadenopathy, progressive metastases along the Suzanne heart border with a large Suzanne-sided pericardial effusion and small-to-moderate Suzanne pleural effusion, small  Suzanne pleural effusion, multifocal lytic osseous metastases. Patient transferred to [facility] for cardiology evaluation and pericardial window but not a candidate due to her widespread metastatic disease and obstruction of the [place] and Partial Obstruction of the Suzanne.   Assessment & Plan:   Principal Problem:   Malignant pericardial effusion (HCC) Active Problems:   SOB (shortness of breath)   Small cell lung cancer (HCC)   S/P pericardiocentesis   Palliative care by specialist   Goals of care, counseling/discussion   Cardiac tamponade   Cancer related pain   Anxiety state  Recurrent Pericardial Effusion ([facility])  -2D echo showed large pericardial effusion with tamponade physiology.   -CVTS consulted by cardiology who reviewed the CT scan which shows total obstruction of the [facility] and subtotal obstruction of the [facility].   -Dr. Darcey [facility] recommended she is not a candidate for pericardial window since once she gets intubated she would be ventilator dependent. -Pericardiocentesis  performed on 12/31 with 900 cc fluid removed.   -Tolerated procedure well.  Fluid sent for studies, no growth so far.  Cytology showed No malignant cells identified -Hasd about 60 cc output in the drain today.   -Now has lower leg edema.  Also has abdominal distention which is new for the past 2 days but is improved -C/w IV Lasix 40 mg Daily -Repeat ECHO showed Moderate to Large Pericardial Effusion today and showed that when compared to 1231 the effusion has reaccumulated and is smaller than prior to pericardiocentesis but larger than at the end of procedure -Cardiology removed Drain today as they cannot leave it in due to risk of infection.  And cardiology feels that because of her terminal illness and her  reaccumulating pericardial effusion with minimal drainage it is futile at this point given her extremely poor prognosis -Patient is in very much denial about  this illness and has not come to grips with her mortality  -Transfer out to telemetry.  Cardiology following. -Audiology is recommending hospice and palliative care is working with patient patient does not want to speak with palliative care -We will continue IV Lasix at this time however because of her worsening and reaccumulating pericardial effusion quickly her prognosis is extremely poor -Oncology evaluated and because she has this incurable condition treatment is all palliative and Dr. Earlie [facility] gave her the option of palliative care and hospice referral versus consideration of palliative systemic chemotherapy with carboplatin and etoposide and Tecntriq however does not want any current treatment at this time as she became visibly upset  -Will continue Palliative Conversations as patient remains FULL CODE -Patient wishes to go to SNF and will consider getting second opinion eleswhere  Metastatic Small Cell Lung Cancer ([facility]) -Multiple visceral and osseous metastases as outlined above.   -Oncology consulted.  Dr. Julien [place] saw patient on 1/1 and discussed at length with her regarding her prognosis and treatment options.   -She has incurable disease and treatment will only be palliative.   -He gave her the option of palliative care with hospice referral versus considering palliative systemic chemotherapy with carboplatin, etoposide and [place].  -Her prognosis without treatment would be <3 months and with treatment the average survival could be up to 9 months depending upon how she responds to the treatment. -Patient would like to get her treatment in [place] close to home and Dr. Worthy [place] office will arrange appointment with oncologist in [place]. -Palliative care for goals of care discussion. -Patient has not come to grips with her terminal illness and becomes upset when discussing her mortality and prognosis  Acute Respiratory Failure with Hypoxia, improved -Improved after  pericardiocentesis but fluid has recaccumulated.  Stable on room air. -Continue to Monitor  Severe Protein Calorie malnutrition/cachexia -Nutrition consult appreciated -Recommending Liberalizeing Diet   Hypokalemia -Improved. K+ was 3.8 -Continue to Monitor and Replete as Necessary -Repeat CMP in AM   Recent Suzanne Hip Fracture -Status post repair about 3 weeks back.  Reports she was ambulating with a walker prior to coming to the hospital.   -PT ordered now that pericardial drain removed.  Leukocytosis -Trending down. WBC went from 14.8 -> 12.5 -> 10.9 -Continue to Monitor for S/Sx of Infection -Repeat CBC in AM  Thrombocytosis -In the setting of Malignancy -Patient's Platelet Count went from 457 -> 451 -Continue to Monitor  -Repeat CBC in AM    Hypophosphatemia -Patient's Phos Level this AM was 2.1 -Replete with po KPhos Neutral 500 mg po BID x2 doses -Continue to Monitor and Replete as Necessary -Repeat Phos Level in AM   Normocytic Anemia -Patient's Hb/Hct went from 10.6/35.2 -> 11.0/36.2 -> 11.5/38.1 -In the Setting of Malignancy -Repeat CBC in AM   DVT prophylaxis: Heparin 5,000 units sq q8h Code Status: FULL CODE Family Communication:  Disposition Plan:   Consultants:   Palliative Care  Cardiology   Procedures: ECHOCARDIOGRAM 02/07/18 ------------------------------------------------------------------- Study Conclusions  - Suzanne ventricle: Systolic function was vigorous. The estimated   ejection fraction was in the range of 65% to 70%. - Pericardium, extracardiac: A large pericardial effusion was   identified circumferential to the heart. There was Suzanne atrial   and Suzanne ventricular chamber collapse. Features were consistent   with tamponade physiology.  Impressions:  - Post  pericardiocentesis images show resolution of pericardial   effusion.  [place]  02/09/2018 ------------------------------------------------------------------- Study Conclusions  - Suzanne ventricle: Systolic function was normal. The estimated   ejection fraction was in the range of 60% to 65%. - Pulmonary arteries: PA peak pressure: 34 mm Hg (S). - Pericardium, extracardiac: A moderate to large pericardial   effusion was identified.  Impressions:  - Limited study to reassess pericardial effusion; normal LV   function; moderate to large pericardial effusion; mild RA   collapse; no RV diastolic collapse; IVC not dilated; when   compared to 02/07/18, effusion has reaccumulated (smaller than   prior to pericardiocentesis but larger than at end of procedure).   Antimicrobials:  Anti-infectives (From admission, onward)   None     Subjective: Seen and examined at bedside and was very upset that her fluid had re-accumulated. No CP or SOB. States that her abdomen is not as distended. No Nausea or Vomiting.  Objective: Vitals:   02/10/18 0400 02/10/18 0500 02/10/18 0600 02/10/18 0720  BP:  109/79 113/83   Pulse:      Resp: (!) 32 (!) 24 19   Temp: 98.9 F (37.2 C)   98.7 F (37.1 C)  TempSrc: Oral   Oral  SpO2:      Weight:      Height:        Intake/Output Summary (Last 24 hours) at 02/10/2018 0744 Last data filed at 02/10/2018 0400 Gross per 24 hour  Intake -  Output 1515 ml  Net -1515 ml   Filed Weights   02/06/18 2220  Weight: 46.2 kg   Examination: Physical Exam:  Constitutional: WN/WD thin ill appearing Caucasian female NAD and appears uncomfortable Eyes: Lids and conjunctivae normal, sclerae anicteric  ENMT: External Ears, Nose appear normal. Grossly normal hearing. Mucous membranes are moist.   Neck: Appears normal, supple, no cervical masses, normal ROM, no appreciable thyromegaly; no JVD Respiratory: Diminished to auscultation bilaterally, no wheezing, rales, rhonchi or crackles. Normal respiratory effort and patient is not tachypenic. No  accessory muscle use. Not using any accessory muscles to breathe Cardiovascular: RRR, no murmurs / rubs / gallops. S1 and S2 auscultated. No extremity edema. 2+ pedal pulses. No carotid bruits.  Abdomen: Soft, non-tender, mildly distended. No masses palpated. No appreciable hepatosplenomegaly. Bowel sounds positive x4.  GU: Deferred. Musculoskeletal: No clubbing / cyanosis of digits/nails. No joint deformity upper and lower extremities.  Skin: No rashes, lesions, ulcers. No induration; Warm and dry. Has Pericardial Drain Neurologic: CN 2-12 grossly intact with no focal deficits. . Romberg sign and cerebellar reflexes not assessed.  Psychiatric: Normal judgment and insight. Alert and oriented x 3. Extermely anxious and tearful.    Data Reviewed: I have personally reviewed following labs and imaging studies  CBC: Recent Labs  Lab 02/07/18 0353 02/08/18 0051 02/09/18 0253  WBC 10.9* 14.8* 12.5*  HGB 10.7* 10.6* 11.0*  HCT 35.8* 35.2* 36.2  MCV 88.0 87.8 87.9  PLT 572* 515* 782*   Basic Metabolic Panel: Recent Labs  Lab 02/07/18 0353 02/08/18 0051 02/08/18 0915 02/10/18 0303  NA 144  --  143 140  K 3.3*  --  4.2 3.8  CL 105  --  110 106  CO2 27  --  24 25  GLUCOSE 105*  --  122* 106*  BUN 13  --  9 10  CREATININE 0.50 0.41* 0.43* 0.41*  CALCIUM 8.4*  --  7.9* 7.7*   GFR: Estimated Creatinine Clearance: 55.2 mL/min (A) (by  C-G formula based on SCr of 0.41 mg/dL (L)). Liver Function Tests: No results for input(s): AST, ALT, ALKPHOS, BILITOT, PROT, ALBUMIN in the last 168 hours. No results for input(s): LIPASE, AMYLASE in the last 168 hours. No results for input(s): AMMONIA in the last 168 hours. Coagulation Profile: No results for input(s): INR, PROTIME in the last 168 hours. Cardiac Enzymes: Recent Labs  Lab 02/07/18 0353 02/07/18 0755 02/07/18 1639  TROPONINI <0.03 <0.03 0.03*   BNP (last 3 results) No results for input(s): PROBNP in the last 8760  hours. HbA1C: No results for input(s): HGBA1C in the last 72 hours. CBG: No results for input(s): GLUCAP in the last 168 hours. Lipid Profile: No results for input(s): CHOL, HDL, LDLCALC, TRIG, CHOLHDL, LDLDIRECT in the last 72 hours. Thyroid Function Tests: No results for input(s): TSH, T4TOTAL, FREET4, T3FREE, THYROIDAB in the last 72 hours. Anemia Panel: No results for input(s): VITAMINB12, FOLATE, FERRITIN, TIBC, IRON, RETICCTPCT in the last 72 hours. Sepsis Labs: No results for input(s): PROCALCITON, LATICACIDVEN in the last 168 hours.  Recent Results (from the past 240 hour(s))  Gram stain     Status: None   Collection Time: 02/07/18  1:17 PM  Result Value Ref Range Status   Specimen Description PERITONEAL  Final   Special Requests FLUID  Final   Gram Stain   Final    RARE WBC PRESENT, PREDOMINANTLY MONONUCLEAR NO ORGANISMS SEEN Performed at [facility], [address] [place], [place] [address]    Report Status 02/07/2018 FINAL  Final  Culture, body fluid-bottle     Status: None (Preliminary result)   Collection Time: 02/07/18  3:17 PM  Result Value Ref Range Status   Specimen Description PERITONEAL  Final   Special Requests NONE  Final   Culture   Final    NO GROWTH 3 DAYS Performed at [facility] [address] [place], [place] [address]    Report Status PENDING  Incomplete  MRSA PCR Screening     Status: None   Collection Time: 02/07/18  4:22 PM  Result Value Ref Range Status   MRSA by PCR NEGATIVE NEGATIVE Final    Comment:        The GeneXpert MRSA Assay (FDA approved for NASAL specimens only), is one component of a comprehensive MRSA colonization surveillance program. It is not intended to diagnose MRSA infection nor to guide or monitor treatment for MRSA infections. Performed at [facility], [place] [address], [place], [place] [address]     Radiology Studies: No results found.  Scheduled Meds: . furosemide  40 mg Intravenous  Daily  . heparin  5,000 Units Subcutaneous Q8H  . morphine  15 mg Oral Q12H  . sodium chloride flush  3 mL Intravenous Q12H   Continuous Infusions: . sodium chloride      LOS: 4 days   Kerney Elbe, DO [facility] PAGER is on AMION  If 7PM-7AM, please contact night-coverage [email or web address] Password [facility] 02/10/2018, 7:44 AM

## 2018-02-10 NOTE — Progress Notes (Signed)
Daily Progress Note   Patient Name: [facility]       Date: 02/10/2018 DOB: 04-May-1960  Age: 58 y.o. MRN#: [id number] Attending Physician: Kerney Elbe, DO Primary Care Physician: Patient, No Pcp Per Admit Date: 02/06/2018  Reason for Consultation/Follow-up: Establishing goals of care, Pain control and Psychosocial/spiritual support  Subjective: Met with patient at bedside.  She is visibly anxious before I speak.  I asked her about her pain control as we  Started MS contin yesterday.  She notices no change and tells me she hurts all over every day from fibromyalgia and arthritis.  She then complains that a staff member told her she has to go to hospice.  [facility] tells me she does not want to go to Hospice as she believes God will heal her.  She wants to go back to [facility] on [address] In [place].     We talked about the recurrent pericardial effusion.  I explained that It is caused by the cancer and if we take the drain out the fluid will come right back.  I asked if we discharge her to [facility] and the fluid comes back would she want to return to the Hospital.  She replied if it was bad enough.  (She really would rather not return to the hospital).  She is confident that only God can help her and that God will help her.  I talked with her about anti-anxiety medication which she politely refused.  She was very insistent that she want the pericardial drain removed tomorrow and she would prefer to discharge to [facility] where she has been before.  She will follow up with Dr. Earlie Server but wants no further treatment in the hospital at this time.  After talking with the patient I talked with the bedside RN, my director, and the [facility] MD in order to brain storm helpful solutions.   Then with patient's permission, I spoke with her son [facility] [phone].  [facility] new about the cancer and the fluid around her heart.  I provided more information about his mother's situation and her very short prognosis.  [facility] committed to talk with the rest of the patient's children and her brother.  He would like for her to stay in the hospital but understands that she does not want  to stay.  [facility] believes that his mother would like to leave the hospital in order to start taking her homeopathic remedies and herbal supplements.  I talked with [facility] about becoming her health care surrogate if she is unable to speak for herself.  He is will to assume that role if necessary.   Assessment: Patient with a very recent (3 wks) diagnosis of wide spread metastatic small cell lung cancer that is growing rapidly - completely obstructing her right mainstem bronchus and partially obstructing her left - further it is causing recurrent pleural effusion that brought her to the ER with Tamponade.   Patient Profile/HPI:  58 y.o. female  with past medical history of COPD, smoker admitted on 02/06/2018 with shortness of breath and pleuritic chest pain radiating to back. [facility] with pathologic acute anterior trochanteric fracture of left hip requiring surgical intervention on 12/8. CT scans revealed multiple tumors involving kidneys, pancrease, liver, lung, right adrenal, peritoneum, retroperitoneum and bone with lumbar vertebral involvement. Biopsy revealed small cell cancer likely primary lung. Also found to have large pericardial effusion and underwent TEE on 12/12 with no signs of RV collapse or compression. Patient discharged to SNF but readmitted for above symptoms. CT angio chest shows extensive tumor in right lower lobe with bulky thoracic lymphadenopathy, progressive metastases along right hear border with large right-sided pericardial effusion, bilateral pleural  effusions, and multifocal lytic osseous metastases. Repeat ECHO 12/28 revealed large pericardial effusion with elevated intrapericardial pressure/early tamponade physiology. Patient not a candidate for pericardial window. S/p pericardiocentesis 12/31 with 900 cc removed. Oncology consulted and has discussed palliative treatment options and prognosis with patient. Palliative medicine consultation for goals of care.    Length of Stay: 4  Current Medications: Scheduled Meds:  . furosemide  40 mg Intravenous Daily  . heparin  5,000 Units Subcutaneous Q8H  . morphine  15 mg Oral Q8H  . sodium chloride flush  3 mL Intravenous Q12H    Continuous Infusions: . sodium chloride      PRN Meds: sodium chloride, acetaminophen, ipratropium-albuterol, LORazepam, morphine injection, ondansetron (ZOFRAN) IV, senna, sodium chloride flush  Physical Exam        Cachectically thin, chronically ill appearing female, awake, alert, coherent, appears anxious. CV tachycardic but regular Resp no distress on 2L  Vital Signs: BP 121/84 (BP Location: Left Arm)   Pulse (!) 109   Temp 98.7 F (37.1 C) (Oral)   Resp (!) 22   Ht '5\' 1"'$  (1.549 m)   Wt 46.2 kg   SpO2 94%   BMI 19.23 kg/m  SpO2: SpO2: 94 % O2 Device: O2 Device: Room Air O2 Flow Rate: O2 Flow Rate (L/min): 2 L/min  Intake/output summary:   Intake/Output Summary (Last 24 hours) at 02/10/2018 1230 Last data filed at 02/10/2018 1148 Gross per 24 hour  Intake -  Output 2090 ml  Net -2090 ml   LBM: Last BM Date: 02/07/18 Baseline Weight: Weight: 46.2 kg Most recent weight: Weight: 46.2 kg       Palliative Assessment/Data: 40%      Patient Active Problem List   Diagnosis Date Noted  . Protein-calorie malnutrition, severe 02/10/2018  . Palliative care by specialist   . Goals of care, counseling/discussion   . Cardiac tamponade   . Cancer related pain   . Anxiety state   . S/P pericardiocentesis 02/08/2018  . SOB (shortness of  breath) 02/07/2018  . Malignant pericardial effusion ([place]) 02/07/2018  . Small  cell lung cancer ([place]) 02/07/2018    Palliative Care Plan    Recommendations/Plan:  Increased MS Contin to 15 mg TID.  Would continue IR oxy for break thru on DC  Offered anti anxiety medication but patient refused.  Recommend Palliative outpatient to follow   We will continue to attempt to gather family support for patient.  Recommend DC to [facility] if stable for discharge - as the patient wishes.  She has capacity and refuses Hospice services although she is clearly eligible.  Greatly appreciate Chaplain involvement.  Will add Catharine Kettlewell to Face sheet as a contact.  Goals of Care and Additional Recommendations:  Limitations on Scope of Treatment: Full Scope Treatment  Code Status:  Full code  Prognosis:  Likely weeks given the rapid progression of her small cell cancer thus far.  Discharge Planning:  To Be Determined to SNF in [place] with Palliative to follow (if she will accept their support)  Care plan was discussed with MD, Director, RN, Son.  Thank you for allowing the Palliative Medicine Team to assist in the care of this patient.  Total time spent:  60 min.     Greater than 50%  of this time was spent counseling and coordinating care related to the above assessment and plan.  Florentina Jenny, PA-C Palliative Medicine  Please contact Palliative MedicineTeam phone at [phone] for questions and concerns between 7 am - 7 pm.   Please see AMION for individual provider pager numbers.

## 2018-02-10 NOTE — Progress Notes (Signed)
Lori from [facility] stated that the facility could not take the patient back for short term. Facility is recommending hospice for the patient and the patient would be responsible for private pay for hospice.   Will need to look for a new facility.   CSW will continue to follow.   Domenic Schwab, MSW, [place]

## 2018-02-10 NOTE — Progress Notes (Signed)
Progress Note  Patient Name: Suzanne Carroll Date of Encounter: 02/10/2018  Primary Cardiologist: No primary care provider on file.   Subjective   Pericardial catheter still draining.  60cc.   Continues to complains of SOB and pleuritic CP  Inpatient Medications    Scheduled Meds: . furosemide  40 mg Intravenous Daily  . heparin  5,000 Units Subcutaneous Q8H  . morphine  15 mg Oral Q12H  . sodium chloride flush  3 mL Intravenous Q12H   Continuous Infusions: . sodium chloride     PRN Meds: sodium chloride, acetaminophen, ipratropium-albuterol, LORazepam, morphine injection, ondansetron (ZOFRAN) IV, senna, sodium chloride flush   Vital Signs    Vitals:   02/10/18 0600 02/10/18 0720 02/10/18 0800 02/10/18 0814  BP: 113/83   111/87  Pulse:   (!) 112 (!) 110  Resp: 19  (!) 21 (!) 25  Temp:  98.7 F (37.1 C)    TempSrc:  Oral    SpO2:   95% 94%  Weight:      Height:        Intake/Output Summary (Last 24 hours) at 02/10/2018 1105 Last data filed at 02/10/2018 0700 Gross per 24 hour  Intake -  Output 1090 ml  Net -1090 ml   Filed Weights   02/06/18 2220  Weight: 46.2 kg    Telemetry    Sinus tachcyardia- Personally Reviewed  ECG    No new EKG to review- Personally Reviewed  Physical Exam   GEN: thin cachectic appearing in mild respiratory distress HEENT: Normal NECK: No JVD; No carotid bruits LYMPHATICS: No lymphadenopathy CARDIAC:RRR, no murmurs, rubs, gallops RESPIRATORY:  Clear to auscultation without rales, wheezing or rhonchi  ABDOMEN: Soft, non-tender, non-distended MUSCULOSKELETAL:  No edema; No deformity  SKIN: Warm and dry NEUROLOGIC:  Alert and oriented x 3 PSYCHIATRIC:  Normal affect    Labs    Chemistry Recent Labs  Lab 02/07/18 0353 02/08/18 0051 02/08/18 0915 02/10/18 0303  NA 144  --  143 140  K 3.3*  --  4.2 3.8  CL 105  --  110 106  CO2 27  --  24 25  GLUCOSE 105*  --  122* 106*  BUN 13  --  9 10  CREATININE 0.50 0.41* 0.43*  0.41*  CALCIUM 8.4*  --  7.9* 7.7*  GFRNONAA >60 >60 >60 >60  GFRAA >60 >60 >60 >60  ANIONGAP 12  --  9 9     Hematology Recent Labs  Lab 02/08/18 0051 02/09/18 0253 02/10/18 0945  WBC 14.8* 12.5* 10.9*  RBC 4.01 4.12 4.37  HGB 10.6* 11.0* 11.5*  HCT 35.2* 36.2 38.1  MCV 87.8 87.9 87.2  MCH 26.4 26.7 26.3  MCHC 30.1 30.4 30.2  RDW 15.0 14.9 15.0  PLT 515* 457* 451*    Cardiac Enzymes Recent Labs  Lab 02/07/18 0353 02/07/18 0755 02/07/18 1639  TROPONINI <0.03 <0.03 0.03*   No results for input(s): TROPIPOC in the last 168 hours.   BNPNo results for input(s): BNP, PROBNP in the last 168 hours.   DDimer No results for input(s): DDIMER in the last 168 hours.   Radiology    No results found.  Cardiac Studies   2D echo 02/07/2018 post pericardiocentesis Study Conclusions  - Left ventricle: Systolic function was vigorous. The estimated   ejection fraction was in the range of 65% to 70%. - Pericardium, extracardiac: A large pericardial effusion was   identified circumferential to the heart. There was right atrial  and right ventricular chamber collapse. Features were consistent   with tamponade physiology.  Impressions:  - Post pericardiocentesis images show resolution of pericardial   effusion.  2D echo 02/07/2018 Study Conclusions  - Left ventricle: The cavity size was normal. Wall thickness was   normal. Systolic function was normal. The estimated ejection   fraction was in the range of 60% to 65%. Wall motion was normal;   there were no regional wall motion abnormalities. The study is   not technically sufficient to allow evaluation of LV diastolic   function. - Aortic valve: There was trivial regurgitation. - Tricuspid valve: There was mild-moderate regurgitation directed   centrally. - Pulmonary arteries: Systolic pressure was mildly increased. PA   peak pressure: 38 mm Hg (S). - Pericardium, extracardiac: A large, free-flowing pericardial    effusion was identified circumferential to the heart. The fluid   had no internal echoes. There was chamber collapse. Features were   consistent with tamponade physiology.   Patient Profile     58 y.o. 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  Initially refused chemotherapy and was found to have a large pericardial effusion with RV compression and significant mitral inflow variation with respiration but patient declined pericardiocentesis few days ago and wanted to be closer to home.  Readmitted with worsening shortness of breath with chest CTA showing extensive right lower lobe tumor with bulky thoracic lymphadenopathy large pericardial effusion and multifocal opacities with possible pneumonia and osseous metastasis.  She is now transferred here for possible pericardial window.  Assessment & Plan    1.  Large pericardial effusion/tamponade  -Is likely malignant given her widely metastatic lung CA. -CVTS deemed her not a pericardial window candidate due to severe load of Ca with obstruction of right main stem bronchus and partial obstruction of left -Patient is very cachectic and ill-appearing with widely metastatic disease.  Chest CT showed large lung tumors possible postobstructive pneumonia and multiple osseous metastasis.  I suspect that her pericardial effusion is only part of what is causing her chest discomfort and shortness of breath. -s/p pericariocentesis yesterday removing 900cc of bloody fluid  -drained 65cc in past 12 hours -repeat echo yesterday showed reaccumulation of pericardial effusion at least moderate in size -Patient has a terminal illness and hurts at keeping pericardial effusion removed are futile at this point. -Cannot leave pericardial catheter in due to risk of infection.  I have explained this at length to the patient -I will remove the pericardial drain today -I have  discussed the case with Dr. Alfredia Ferguson.  Patient has a terminal illness with life expectancy of less than 3 months.  She is very much in denial of her illness and has refused to go home with hospice. -I have no further recommendations at this time and no further treatment to offer her. -CODE STATUS needs to be addressed and further treatment for palliative care at home per palliative care physicians.  2.  Widespread metastatic small cell lung CA -Recommend immediate oncology consult to determine prognosis going forward which I assume is poor -Palliative care consult pending although she tells me she is not interested and she is considering going somewhere else for a second opionin  [place] will sign off.   Medication Recommendations:  none Other recommendations (labs, testing, etc):  none Follow up as an outpatient:  none For questions or updates, please contact [place] Please  consult [email or web address] for contact info under Cardiology/STEMI.      Signed, Fransico Him, MD  02/10/2018, 11:05 AM

## 2018-02-11 LAB — COMPREHENSIVE METABOLIC PANEL
ALT: 16 U/L (ref 0–44)
AST: 24 U/L (ref 15–41)
Albumin: 2.1 g/dL — ABNORMAL LOW (ref 3.5–5.0)
Alkaline Phosphatase: 75 U/L (ref 38–126)
Anion gap: 8 (ref 5–15)
BUN: 6 mg/dL (ref 6–20)
CHLORIDE: 105 mmol/L (ref 98–111)
CO2: 26 mmol/L (ref 22–32)
Calcium: 7.6 mg/dL — ABNORMAL LOW (ref 8.9–10.3)
Creatinine, Ser: 0.47 mg/dL (ref 0.44–1.00)
GFR calc Af Amer: >60 mL/min (ref >60)
GFR calc non Af Amer: >60 mL/min (ref >60)
Glucose, Bld: 107 mg/dL — ABNORMAL HIGH (ref 70–99)
Potassium: 3.8 mmol/L (ref 3.5–5.1)
Sodium: 139 mmol/L (ref 135–145)
Total Bilirubin: 0.1 mg/dL — ABNORMAL LOW (ref 0.3–1.2)
Total Protein: 5.4 g/dL — ABNORMAL LOW (ref 6.5–8.1)

## 2018-02-11 LAB — CBC WITH DIFFERENTIAL/PLATELET
Abs Immature Granulocytes: 0.03 10*3/uL (ref 0.00–0.07)
BASOS PCT: 1 %
Basophils Absolute: 0.1 10*3/uL (ref 0.0–0.1)
Eosinophils Absolute: 0.2 10*3/uL (ref 0.0–0.5)
Eosinophils Relative: 2 %
HCT: 33.7 % — ABNORMAL LOW (ref 36.0–46.0)
Hemoglobin: 10.6 g/dL — ABNORMAL LOW (ref 12.0–15.0)
Immature Granulocytes: 0 %
LYMPHS PCT: 13 %
Lymphs Abs: 1.3 10*3/uL (ref 0.7–4.0)
MCH: 27.5 pg (ref 26.0–34.0)
MCHC: 31.5 g/dL (ref 30.0–36.0)
MCV: 87.5 fL (ref 80.0–100.0)
MONO ABS: 0.8 10*3/uL (ref 0.1–1.0)
Monocytes Relative: 8 %
Neutro Abs: 7.6 10*3/uL (ref 1.7–7.7)
Neutrophils Relative %: 76 %
PLATELETS: 488 10*3/uL — AB (ref 150–400)
RBC: 3.85 MIL/uL — ABNORMAL LOW (ref 3.87–5.11)
RDW: 15.1 % (ref 11.5–15.5)
WBC: 10.1 10*3/uL (ref 4.0–10.5)
nRBC: 0 % (ref 0.0–0.2)

## 2018-02-11 LAB — PHOSPHORUS: Phosphorus: 2 mg/dL — ABNORMAL LOW (ref 2.5–4.6)

## 2018-02-11 LAB — MAGNESIUM: Magnesium: 1.8 mg/dL (ref 1.7–2.4)

## 2018-02-11 MED ORDER — POTASSIUM & SODIUM PHOSPHATES 280-160-250 MG PO PACK
1.0000 | PACK | Freq: Two times a day (BID) | ORAL | Status: DC
  Administered 2018-02-11 – 2018-02-12 (×2): 1 via ORAL
  Filled 2018-02-11 (×9): qty 1

## 2018-02-11 MED ORDER — K PHOS MONO-SOD PHOS DI & MONO 155-852-130 MG PO TABS
500.0000 mg | ORAL_TABLET | Freq: Two times a day (BID) | ORAL | Status: DC
  Filled 2018-02-11: qty 2

## 2018-02-11 NOTE — Plan of Care (Signed)
Cardiac Monitor. Continuous Pulse Ox. Increase Activity as tolerated.

## 2018-02-11 NOTE — Evaluation (Addendum)
Physical Therapy Evaluation Patient Details Name: Suzanne Carroll MRN: [id number] DOB: [birth year] Today's Date: 02/11/2018   History of Present Illness  58 y.o. 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  Initially refused chemotherapy and was found to have a large pericardial effusion with RV compression and significant mitral inflow variation with respiration but patient declined pericardiocentesis few days ago and wanted to be closer to home.  Readmitted with worsening shortness of breath with chest CTA showing extensive right lower lobe tumor with bulky thoracic lymphadenopathy large pericardial effusion and multifocal opacities with possible pneumonia and osseous metastasis.  She is now transferred here for possible pericardial window.  Clinical Impression  Pt admitted with above diagnosis. Pt currently with functional limitations due to the deficits listed below (see PT Problem List). Pt was able to ambulate with RW in room but needed min assist as pt unsteady with LOB needing steadying assist.  Pt will need SNF prior to d/c home.  Will follow acutely.  Pt will benefit from skilled PT to increase their independence and safety with mobility to allow discharge to the venue listed below.    SATURATION QUALIFICATIONS: (This note is used to comply with regulatory documentation for home oxygen)  Patient Saturations on Room Air at Rest = 93%  Patient Saturations on Room Air while Ambulating = 87%  Patient Saturations on 2 Liters of oxygen while Ambulating = 93%  Please briefly explain why patient needs home oxygen:Pt desats on RA with activity.  Will need home O2.   Follow Up Recommendations SNF;Supervision/Assistance - 24 hour    Equipment Recommendations  Rolling walker with 5" wheels;3in1 (PT) home O2   Recommendations for Other Services       Precautions / Restrictions Precautions Precautions:  Fall Restrictions Weight Bearing Restrictions: No      Mobility  Bed Mobility Overal bed mobility: Independent             General bed mobility comments: Pt purewick was leaking so bed and gown soaked.    Transfers Overall transfer level: Needs assistance Equipment used: Rolling walker (2 wheeled) Transfers: Sit to/from Stand Sit to Stand: Min guard;Min assist         General transfer comment: No assist needed.  Guard for safety. cues for hand placement  Ambulation/Gait Ambulation/Gait assistance: Min assist Gait Distance (Feet): 50 Feet(25 x 2) Assistive device: Rolling walker (2 wheeled) Gait Pattern/deviations: Step-to pattern;Decreased step length - right;Decreased step length - left;Shuffle;Ataxic;Drifts right/left;Trunk flexed;Narrow base of support   Gait velocity interpretation: <1.31 ft/sec, indicative of household ambulator General Gait Details: Pt with unsteady gait with at least one LOB needing physical assist to correct.  Pt with difficulty separating feet and with narrow BOS which did not help pt with gait.  Even with RW, PT had to provide assist to pt.  Pt walked to bathoom and urinated.  Assisted pt to get clean and changed her gown.   Stairs            Wheelchair Mobility    Modified Rankin (Stroke Patients Only)       Balance Overall balance assessment: Needs assistance Sitting-balance support: No upper extremity supported;Feet supported Sitting balance-Leahy Scale: Fair     Standing balance support: Bilateral upper extremity supported;During functional activity Standing balance-Leahy Scale: Poor Standing balance comment: relies on UE support and external support for balance.  Pertinent Vitals/Pain Pain Assessment: No/denies pain    Home Living Family/patient expects to be discharged to:: Private residence Living Arrangements: Non-relatives/Friends Available Help at Discharge:  Friend(s);Available 24 hours/day Type of Home: House Home Access: Stairs to enter Entrance Stairs-Rails: None Entrance Stairs-Number of Steps: 3 Home Layout: One level Home Equipment: None      Prior Function Level of Independence: Independent               Hand Dominance   Dominant Hand: Right    Extremity/Trunk Assessment   Upper Extremity Assessment Upper Extremity Assessment: Defer to OT evaluation    Lower Extremity Assessment Lower Extremity Assessment: Generalized weakness    Cervical / Trunk Assessment Cervical / Trunk Assessment: Kyphotic  Communication   Communication: No difficulties  Cognition Arousal/Alertness: Awake/alert Behavior During Therapy: WFL for tasks assessed/performed Overall Cognitive Status: Within Functional Limits for tasks assessed                                        General Comments General comments (skin integrity, edema, etc.): Removed O2 for ambulation and sats 88-93%.  Did desat to 87% at end of treatment. Replaced 2LO2 and O2 93%     Exercises     Assessment/Plan    PT Assessment Patient needs continued PT services  PT Problem List Decreased activity tolerance;Decreased balance;Decreased mobility;Decreased knowledge of use of DME;Decreased safety awareness;Decreased knowledge of precautions       PT Treatment Interventions DME instruction;Gait training;Therapeutic exercise;Therapeutic activities;Functional mobility training;Balance training;Patient/family education    PT Goals (Current goals can be found in the Care Plan section)  Acute Rehab PT Goals Patient Stated Goal: to go home PT Goal Formulation: With patient Time For Goal Achievement: 02/25/18 Potential to Achieve Goals: Good    Frequency Min 2X/week   Barriers to discharge Decreased caregiver support      Co-evaluation               AM-PAC PT "6 Clicks" Mobility  Outcome Measure Help needed turning from your back to your  side while in a flat bed without using bedrails?: None Help needed moving from lying on your back to sitting on the side of a flat bed without using bedrails?: None Help needed moving to and from a bed to a chair (including a wheelchair)?: A Little Help needed standing up from a chair using your arms (e.g., wheelchair or bedside chair)?: A Little Help needed to walk in hospital room?: A Little Help needed climbing 3-5 steps with a railing? : A Lot 6 Click Score: 19    End of Session Equipment Utilized During Treatment: Gait belt;Oxygen Activity Tolerance: Patient limited by fatigue Patient left: in chair;with call bell/phone within reach;with chair alarm set Nurse Communication: Mobility status(needs purewick back on or toilet often) PT Visit Diagnosis: Unsteadiness on feet (R26.81);Muscle weakness (generalized) (M62.81)    Time: 0932-6712 PT Time Calculation (min) (ACUTE ONLY): 36 min   Charges:   PT Evaluation $PT Eval Moderate Complexity: 1 Mod PT Treatments $Gait Training: 8-22 mins        [place] Pager:  [phone]  Office:  [place] 02/11/2018, 1:54 PM

## 2018-02-11 NOTE — Progress Notes (Signed)
SATURATION QUALIFICATIONS: (This note is used to comply with regulatory documentation for home oxygen)  Patient Saturations on Room Air at Rest = 93%  Patient Saturations on Room Air while Ambulating = 87%  Patient Saturations on 2 Liters of oxygen while Ambulating = 93%  Please briefly explain why patient needs home oxygen:Pt desats on RA with activity.  Will need home O2.  Thanks.  [place] Pager:  [phone]  Office:  [phone]

## 2018-02-11 NOTE — Progress Notes (Signed)
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  She was hospitalized from 12/7-12/29 at [facility] with pathological acute anterior trochanteric fracture of the left hip requiring surgical intervention on 12/8.  During that time a CT angiogram of the chest, abdomen and pelvis showed multiple tumors involving the kidneys, pancreas, liver, lung, right adrenal, peritoneum, retroperitoneum and bone with lumbar vertebral involvement.  Bone biopsy done during surgery showed finding of small cell cancer likely lung primary. Oncology was consulted during hospitalization and offered chemotherapy and radiation however patient refused and wanted to return back with her family in [place] and get worked up.  She was also found to have large pericardial effusion and underwent TEE on 12/12 showing inconsistent pericardial effusion with no signs of RV collapse, compression.  Repeat echo on 12/28 showed large pericardial effusion with elevated intrapericardial pressure and early tamponade physiology.  Patient however refused intervention and was sent to skilled nursing facility. However she started having worsening shortness of breath and pleuritic chest pain radiating to her back on 12/30.  Also complained of leg swelling and shin pain.  She was presented at end of hospital ED where she was tachycardic in low 100s, tachypneic with stable blood pressure and afebrile.  O2 sat of 98% on 2 L.  Blood work showed mild leukocytosis, hemoglobin of 11.6, platelets of 577, K of 3.3, normal renal function.  CT angiogram of the chest showed extensive tumor in the right lower lobe with bulky thoracic lymphadenopathy, progressive metastases along the right heart border with a large right-sided pericardial effusion and small-to-moderate right pleural effusion, small  left pleural effusion, multifocal lytic osseous metastases.  Patient transferred to [facility] for cardiology evaluation and pericardial window but not a candidate due to her widespread metastatic disease and obstruction of the Right Mainstem Bronchus and Partial Obstruction of the Left. Have stopped the Lasix because of concern of Hastening Pre-load reduction and worsening of Pericardial Effusion causing cardiac tamponade.   Assessment & Plan:   Principal Problem:   Malignant pericardial effusion (HCC) Active Problems:   SOB (shortness of breath)   Small cell lung cancer (HCC)   S/P pericardiocentesis   Palliative care by specialist   Goals of care, counseling/discussion   Cardiac tamponade   Cancer related pain   Anxiety state   Protein-calorie malnutrition, severe   Palliative care encounter  Recurrent Pericardial Effusion ([place])  -2D echo showed large pericardial effusion with tamponade physiology.   -CVTS consulted by cardiology who reviewed the CT scan which shows total obstruction of the right mainstem bronchus and subtotal obstruction of the left mainstem bronchus.   -Dr. Darcey Nora recommended she is not a candidate for pericardial window since once she gets intubated she would be ventilator dependent. -Pericardiocentesis  performed on 12/31 with 900 cc fluid removed.   -Tolerated procedure well.  Fluid sent for studies, no growth so far.  Cytology showed No malignant cells identified -Hasd about 60 cc output in the drain today.   -Now has lower leg edema.  Also has abdominal distention which is new for the past 2 days but is improved -D/C'd IV Lasix 40 mg Daily been concern for causing preload reduction and hastening cardiac decompensation in the setting of likely cardiac tamponade -Repeat ECHO showed Moderate to Large Pericardial Effusion today and showed that when compared to  1231 the effusion has reaccumulated and is smaller than prior to pericardiocentesis but larger than at  the end of procedure -Cardiology removed Drain today as they cannot leave it in due to risk of infection.  And cardiology feels that because of her terminal illness and her reaccumulating pericardial effusion with minimal drainage it is futile at this point given her extremely poor prognosis -Patient is in very much denial about this illness and has not come to grips with her mortality  -Transfer out to telemetry.  Cardiology following. -Cardiology is recommending hospice and palliative care working with patient patient does not want to speak with palliative care; cardiology feels she has no other options and feels like she will decompensate in the setting of likely cardiac tamponade and states that they will not repeat a pericardiocentesis -Continue IV Lasix at this time because of her worsening and reaccumulating pericardial effusion quickly and we do not want to culture due to her preload reduction that would cause hastening of decompensation in the setting of likely cardiac tamponade that will happen; her prognosis is extremely poor -Oncology evaluated and because she has this incurable condition treatment is all palliative and Dr. Earlie Server gave her the option of palliative care and hospice referral versus consideration of palliative systemic chemotherapy with carboplatin and etoposide and Tecntriq however does not want any current treatment at this time as she became visibly upset  -Will continue Palliative Conversations as patient remains FULL CODE -Patient wishes to go to SNF and will consider getting second opinion eleswhere; have asked PT OT to reevaluate given that she cannot go back to her old facility -She will need oxygen at discharge along with other therapies  Metastatic Small Cell Lung Cancer ([place]) -Multiple visceral and osseous metastases as outlined above.   -Oncology consulted.  Dr. Julien Nordmann saw patient on 1/1 and discussed at length with her regarding her prognosis and treatment  options.   -She has incurable disease and treatment will only be palliative.   -He gave her the option of palliative care with hospice referral versus considering palliative systemic chemotherapy with carboplatin, etoposide and Tecentriq.  -Her prognosis without treatment would be <3 months and with treatment the average survival could be up to 9 months depending upon how she responds to the treatment. -Patient would like to get her treatment in [place] close to home and Dr. Worthy Flank office will arrange appointment with oncologist in [place]. -Palliative care for goals of care discussion. -Patient has not come to grips with her terminal illness and becomes upset when discussing her mortality and prognosis -As above -We will discontinue IV Lasix given concern of preload reduction  Acute Respiratory Failure with Hypoxia, improved -Improved after pericardiocentesis but fluid has recaccumulated.  Now back on oxygen -Home O2 screen done and she needs at least 2 L -Because her fluid accumulated very quickly this prognosis very poor and it is again recommended to her to go to hospice however she refuses -Will need oxygen at discharge to skilled nursing facility  Severe Protein Calorie malnutrition/cachexia -Nutrition consult appreciated -Recommending Liberalizeing Diet   Hypokalemia -Improved. K+ was 3.8 -Continue to Monitor and Replete as Necessary -Repeat CMP in AM   Recent Left Hip Fracture -Status post repair about 3 weeks back.  Reports she was ambulating with a walker prior to coming to the hospital.   -PT ordered now that pericardial drain removed and recommending SNF  Leukocytosis -Trending down. WBC went from 14.8 -> 12.5 -> 10.9 -> 10.1 -Continue to  Monitor for S/Sx of Infection -Repeat CBC in AM  Thrombocytosis -In the setting of Malignancy -Patient's Platelet Count went from 457 -> 451 -> 488 -Continue to Monitor  -Repeat CBC in AM    Hypophosphatemia -Patient's  Phos Level this AM was 2.0 -Replete with po KPhos Neutral 500 mg po BID x2 doses again -Continue to Monitor and Replete as Necessary -Repeat Phos Level in AM   Normocytic Anemia -Patient's Hb/Hct went from 10.6/35.2 -> 11.0/36.2 -> 11.5/38.1 -> 10.6/33.7 -In the Setting of Malignancy -Repeat CBC in AM   DVT prophylaxis: Heparin 5,000 units sq q8h Code Status: FULL CODE Family Communication: No family present at bedside Disposition Plan: It is recommended that she go to Hospice as Cardiology has no options for her. Her prognosis is extremely poor but patient does not want to talk to [place] so she will be D/C'd back to SNF  Consultants:   Palliative Care  Cardiology   Procedures: ECHOCARDIOGRAM 02/07/18 ------------------------------------------------------------------- Study Conclusions  - Left ventricle: Systolic function was vigorous. The estimated   ejection fraction was in the range of 65% to 70%. - Pericardium, extracardiac: A large pericardial effusion was   identified circumferential to the heart. There was right atrial   and right ventricular chamber collapse. Features were consistent   with tamponade physiology.  Impressions:  - Post pericardiocentesis images show resolution of pericardial   effusion.  [place] 02/09/2018 ------------------------------------------------------------------- Study Conclusions  - Left ventricle: Systolic function was normal. The estimated   ejection fraction was in the range of 60% to 65%. - Pulmonary arteries: PA peak pressure: 34 mm Hg (S). - Pericardium, extracardiac: A moderate to large pericardial   effusion was identified.  Impressions:  - Limited study to reassess pericardial effusion; normal LV   function; moderate to large pericardial effusion; mild RA   collapse; no RV diastolic collapse; IVC not dilated; when   compared to 02/07/18, effusion has reaccumulated (smaller than   prior to  pericardiocentesis but larger than at end of procedure).   Antimicrobials:  Anti-infectives (From admission, onward)   None     Subjective: Seen and examined at bedside is wanting to go to skilled nursing facility today.  Denies any chest pain or shortness of breath however she ended up back on oxygen 2 L.  She is still upset and wants to go back to SNF to get rehab and does not want to talk about chemotherapy or palliative options.  Stating the telemetry box was weighing on her chest.  Objective: Vitals:   02/10/18 1856 02/10/18 2038 02/11/18 0631 02/11/18 1428  BP: 100/77 104/83 110/76 102/70  Pulse: (!) 120 (!) 115 (!) 110 (!) 107  Resp: (!) 30 19 20  (!) 28  Temp: 98.4 F (36.9 C) 98.5 F (36.9 C) 98.5 F (36.9 C) 98.1 F (36.7 C)  TempSrc: Oral Oral Oral Oral  SpO2: 99% 99% 97% 100%  Weight:      Height:        Intake/Output Summary (Last 24 hours) at 02/11/2018 1858 Last data filed at 02/11/2018 1305 Gross per 24 hour  Intake -  Output 600 ml  Net -600 ml   Filed Weights   02/06/18 2220  Weight: 46.2 kg   Examination: Physical Exam:  Constitutional: Well-nourished, well-developed thin and ill-appearing Caucasian female currently in no acute distress but still looks uncomfortable Eyes: Lids and conjunctive are normal.  Sclera anicteric ENMT: External ears and nose appear normal.  Grossly normal hearing.  Mucous  members are moist  Neck: Appears normal with no evidence of JVD Respiratory: Diminished auscultation bilaterally no appreciable wheezing, rales, rhonchi.  She is wearing supplemental oxygen via nasal cannula now but is not tachypneic Cardiovascular: Diminished heart sounds but regular rate and rhythm.  Has 1+ lower extremity edema Abdomen: Soft, nontender, distended slightly.  Nontender.  Bowel sounds present in 4 quadrants GU: Deferred Musculoskeletal: No clubbing or cyanosis.  No joint deformities upper extremities Skin: Is warm and dry.  No appreciable  rashes or lesions limited skin evaluation.  Pericardial drain is been removed Neurologic: Cranial nerves II through XII grossly intact no appreciable focal deficits. Psychiatric: Very anxious and tearful again.  Awake alert and oriented x3  Data Reviewed: I have personally reviewed following labs and imaging studies  CBC: Recent Labs  Lab 02/07/18 0353 02/08/18 0051 02/09/18 0253 02/10/18 0945 02/11/18 0400  WBC 10.9* 14.8* 12.5* 10.9* 10.1  NEUTROABS  --   --   --  8.8* 7.6  HGB 10.7* 10.6* 11.0* 11.5* 10.6*  HCT 35.8* 35.2* 36.2 38.1 33.7*  MCV 88.0 87.8 87.9 87.2 87.5  PLT 572* 515* 457* 451* 621*   Basic Metabolic Panel: Recent Labs  Lab 02/07/18 0353 02/08/18 0051 02/08/18 0915 02/10/18 0303 02/10/18 0945 02/11/18 0400  NA 144  --  143 140  --  139  K 3.3*  --  4.2 3.8  --  3.8  CL 105  --  110 106  --  105  CO2 27  --  24 25  --  26  GLUCOSE 105*  --  122* 106*  --  107*  BUN 13  --  9 10  --  6  CREATININE 0.50 0.41* 0.43* 0.41*  --  0.47  CALCIUM 8.4*  --  7.9* 7.7*  --  7.6*  MG  --   --   --   --  1.8 1.8  PHOS  --   --   --   --  2.1* 2.0*   GFR: Estimated Creatinine Clearance: 55.2 mL/min (by C-G formula based on SCr of 0.47 mg/dL). Liver Function Tests: Recent Labs  Lab 02/11/18 0400  AST 24  ALT 16  ALKPHOS 75  BILITOT 0.1*  PROT 5.4*  ALBUMIN 2.1*   No results for input(s): LIPASE, AMYLASE in the last 168 hours. No results for input(s): AMMONIA in the last 168 hours. Coagulation Profile: No results for input(s): INR, PROTIME in the last 168 hours. Cardiac Enzymes: Recent Labs  Lab 02/07/18 0353 02/07/18 0755 02/07/18 1639  TROPONINI <0.03 <0.03 0.03*   BNP (last 3 results) No results for input(s): PROBNP in the last 8760 hours. HbA1C: No results for input(s): HGBA1C in the last 72 hours. CBG: No results for input(s): GLUCAP in the last 168 hours. Lipid Profile: No results for input(s): CHOL, HDL, LDLCALC, TRIG, CHOLHDL, LDLDIRECT  in the last 72 hours. Thyroid Function Tests: No results for input(s): TSH, T4TOTAL, FREET4, T3FREE, THYROIDAB in the last 72 hours. Anemia Panel: No results for input(s): VITAMINB12, FOLATE, FERRITIN, TIBC, IRON, RETICCTPCT in the last 72 hours. Sepsis Labs: No results for input(s): PROCALCITON, LATICACIDVEN in the last 168 hours.  Recent Results (from the past 240 hour(s))  Gram stain     Status: None   Collection Time: 02/07/18  1:17 PM  Result Value Ref Range Status   Specimen Description PERITONEAL  Final   Special Requests FLUID  Final   Gram Stain   Final  RARE WBC PRESENT, PREDOMINANTLY MONONUCLEAR NO ORGANISMS SEEN Performed at [facility] [address] [place], [place] [address]    Report Status 02/07/2018 FINAL  Final  Culture, body fluid-bottle     Status: None (Preliminary result)   Collection Time: 02/07/18  3:17 PM  Result Value Ref Range Status   Specimen Description PERITONEAL  Final   Special Requests NONE  Final   Culture NO GROWTH 4 DAYS  Final   Report Status PENDING  Incomplete  MRSA PCR Screening     Status: None   Collection Time: 02/07/18  4:22 PM  Result Value Ref Range Status   MRSA by PCR NEGATIVE NEGATIVE Final    Comment:        The GeneXpert MRSA Assay (FDA approved for NASAL specimens only), is one component of a comprehensive MRSA colonization surveillance program. It is not intended to diagnose MRSA infection nor to guide or monitor treatment for MRSA infections. Performed at [facility], [place] [address] [place], [place] [address]     Radiology Studies: No results found.  Scheduled Meds: . heparin  5,000 Units Subcutaneous Q8H  . morphine  15 mg Oral Q8H  . potassium & sodium phosphates  1 packet Oral BID WC  . sodium chloride flush  3 mL Intravenous Q12H   Continuous Infusions: . sodium chloride      LOS: 5 days   Kerney Elbe, DO [facility] PAGER is on AMION  If 7PM-7AM, please contact  night-coverage [email or web address] Password [facility] 02/11/2018, 6:58 PM

## 2018-02-11 NOTE — Social Work (Signed)
Aware that pt has informed PMT that she wants to return SNF and is declining hospice at this time. Pt SNF where she was residing has declined to accept pt to return as they do not feel they meet pt needs at this time. CSW will need new therapy evaluations in order for pt to be placed due to insurance coverage ([employer] managed).  CSW continuing to follow for support with disposition when medically appropriate.  Westley Hummer, MSW, [place] [employer] [phone]

## 2018-02-12 ENCOUNTER — Inpatient Hospital Stay (HOSPITAL_COMMUNITY): Payer: BLUE CROSS/BLUE SHIELD

## 2018-02-12 LAB — CBC WITH DIFFERENTIAL/PLATELET
Abs Immature Granulocytes: 0.06 10*3/uL (ref 0.00–0.07)
Basophils Absolute: 0.2 10*3/uL — ABNORMAL HIGH (ref 0.0–0.1)
Basophils Relative: 1 %
Eosinophils Absolute: 0.3 10*3/uL (ref 0.0–0.5)
Eosinophils Relative: 2 %
HCT: 34.2 % — ABNORMAL LOW (ref 36.0–46.0)
Hemoglobin: 10.6 g/dL — ABNORMAL LOW (ref 12.0–15.0)
Immature Granulocytes: 1 %
LYMPHS PCT: 13 %
Lymphs Abs: 1.5 10*3/uL (ref 0.7–4.0)
MCH: 27.4 pg (ref 26.0–34.0)
MCHC: 31 g/dL (ref 30.0–36.0)
MCV: 88.4 fL (ref 80.0–100.0)
MONOS PCT: 9 %
Monocytes Absolute: 1.1 10*3/uL — ABNORMAL HIGH (ref 0.1–1.0)
Neutro Abs: 8.5 10*3/uL — ABNORMAL HIGH (ref 1.7–7.7)
Neutrophils Relative %: 74 %
Platelets: 500 10*3/uL — ABNORMAL HIGH (ref 150–400)
RBC: 3.87 MIL/uL (ref 3.87–5.11)
RDW: 15.2 % (ref 11.5–15.5)
WBC: 11.5 10*3/uL — ABNORMAL HIGH (ref 4.0–10.5)
nRBC: 0 % (ref 0.0–0.2)

## 2018-02-12 LAB — PHOSPHORUS: PHOSPHORUS: 2.6 mg/dL (ref 2.5–4.6)

## 2018-02-12 LAB — COMPREHENSIVE METABOLIC PANEL
ALT: 15 U/L (ref 0–44)
AST: 25 U/L (ref 15–41)
Albumin: 2.1 g/dL — ABNORMAL LOW (ref 3.5–5.0)
Alkaline Phosphatase: 75 U/L (ref 38–126)
Anion gap: 7 (ref 5–15)
BUN: 13 mg/dL (ref 6–20)
CO2: 26 mmol/L (ref 22–32)
Calcium: 8.1 mg/dL — ABNORMAL LOW (ref 8.9–10.3)
Chloride: 107 mmol/L (ref 98–111)
Creatinine, Ser: 0.47 mg/dL (ref 0.44–1.00)
GFR calc Af Amer: >60 mL/min (ref >60)
GFR calc non Af Amer: >60 mL/min (ref >60)
Glucose, Bld: 118 mg/dL — ABNORMAL HIGH (ref 70–99)
Potassium: 3.7 mmol/L (ref 3.5–5.1)
Sodium: 140 mmol/L (ref 135–145)
Total Bilirubin: 0.3 mg/dL (ref 0.3–1.2)
Total Protein: 5.7 g/dL — ABNORMAL LOW (ref 6.5–8.1)

## 2018-02-12 LAB — CULTURE, BODY FLUID W GRAM STAIN -BOTTLE: Culture: NO GROWTH

## 2018-02-12 LAB — MAGNESIUM: Magnesium: 1.9 mg/dL (ref 1.7–2.4)

## 2018-02-12 MED ORDER — FUROSEMIDE 40 MG PO TABS
40.0000 mg | ORAL_TABLET | Freq: Every day | ORAL | Status: DC
  Administered 2018-02-12 – 2018-02-15 (×3): 40 mg via ORAL
  Filled 2018-02-12 (×4): qty 1

## 2018-02-12 NOTE — Plan of Care (Signed)
Patient is in a lot of pain which makes it difficult to help her with ADL's and to be able to improve in her status, will continue to try to medicate her to keep her pain more manageable.

## 2018-02-12 NOTE — Evaluation (Addendum)
Occupational Therapy Evaluation Patient Details Name: Suzanne Carroll MRN: [id number] DOB: 26-Oct-1960 Today's Date: 02/12/2018    History of Present Illness 58 y.o. 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  Initially refused chemotherapy and was found to have a large pericardial effusion with RV compression and significant mitral inflow variation with respiration but patient declined pericardiocentesis few days ago and wanted to be closer to home.  Readmitted with worsening shortness of breath with chest CTA showing extensive right lower lobe tumor with bulky thoracic lymphadenopathy large pericardial effusion and multifocal opacities with possible pneumonia and osseous metastasis.  She is now transferred here for possible pericardial window.   Clinical Impression   Pt reporting she was at [facility] for rehab prior to admission and needed help with "everything and walking". Pt currently requiring Max A for LB ADLs and functional mobility due to pain, fatigue, and anxiety. Pt requiring increased encouragement for performance of ADLs. Pt presenting with decreased cognition as seen by poor problem solving, awareness, ST memory, and attention. SpO2 >94% on RA and HR 120s throughout session. Pt would benefit from further acute OT to facilitate safe dc. Recommend dc to SNF for further OT to optimize safety, independence with ADLs, and return to PLOF.      Follow Up Recommendations  SNF;Supervision/Assistance - 24 hour    Equipment Recommendations  Other (comment)(Defer to next venue)    Recommendations for Other Services PT consult     Precautions / Restrictions Precautions Precautions: Fall Restrictions Weight Bearing Restrictions: No      Mobility Bed Mobility Overal bed mobility: Needs Assistance Bed Mobility: Sit to Supine       Sit to supine: Min assist   General bed mobility comments: Min A to bring  RLE over  EOB. Increased time and effort required throughout  Transfers Overall transfer level: Needs assistance   Transfers: Sit to/from Stand Sit to Stand: Min assist;From elevated surface         General transfer comment: Min A for stability and power up into standing    Balance Overall balance assessment: Needs assistance Sitting-balance support: No upper extremity supported;Feet supported Sitting balance-Leahy Scale: Fair     Standing balance support: Bilateral upper extremity supported;During functional activity Standing balance-Leahy Scale: Poor Standing balance comment: relies on UE support and external support for balance.                            ADL either performed or assessed with clinical judgement   ADL Overall ADL's : Needs assistance/impaired Eating/Feeding: Set up;Sitting;Supervision/ safety   Grooming: Supervision/safety;Set up;Sitting   Upper Body Bathing: Minimal assistance;Sitting   Lower Body Bathing: Maximal assistance;Sit to/from stand   Upper Body Dressing : Minimal assistance;Sitting   Lower Body Dressing: Maximal assistance;Sit to/from stand Lower Body Dressing Details (indicate cue type and reason): Pt donning right sock with Min A to bring RLE towards left knee. Pt with difficulty donning left socks due to pain and required Max A. Pt requiring Max A for standing balance and is very fearful of falling.  Toilet Transfer: Maximal assistance;Ambulation(simulated in room) Toilet Transfer Details (indicate cue type and reason): Pt only taking a few steps and required Max A throughout due to poor balance, strength, and fear         Functional mobility during ADLs: Maximal assistance General ADL Comments: Pt limited by pain,  fear of falling, decreased strength, and poor balance     Vision Baseline Vision/History: Wears glasses Patient Visual Report: No change from baseline       Perception     Praxis      Pertinent Vitals/Pain Pain  Assessment: Faces Faces Pain Scale: Hurts even more Pain Location: Chest and LLE Pain Descriptors / Indicators: Constant;Discomfort;Grimacing;Guarding Pain Intervention(s): Limited activity within patient's tolerance;Monitored during session;Repositioned     Hand Dominance Right   Extremity/Trunk Assessment Upper Extremity Assessment Upper Extremity Assessment: Generalized weakness   Lower Extremity Assessment Lower Extremity Assessment: Generalized weakness;LLE deficits/detail LLE Deficits / Details: Left leg pain and pt reporting she is afraid she is going to break her leg again LLE Coordination: decreased gross motor   Cervical / Trunk Assessment Cervical / Trunk Assessment: Kyphotic   Communication Communication Communication: No difficulties   Cognition Arousal/Alertness: Awake/alert Behavior During Therapy: Anxious Overall Cognitive Status: No family/caregiver present to determine baseline cognitive functioning Area of Impairment: Attention;Memory;Following commands;Safety/judgement;Awareness;Problem solving                   Current Attention Level: Sustained Memory: Decreased short-term memory Following Commands: Follows one step commands with increased time;Follows one step commands inconsistently Safety/Judgement: Decreased awareness of safety Awareness: Emergent Problem Solving: Slow processing;Requires verbal cues General Comments: Pt with difficulty answering PLOF questions. Pt also presenting with fearfulness of pain and movements. Requiring increased encouragement. Pt stating "I just want to get better so I can laugh again."   General Comments  SpO2 >94% on RA and HR 120s throughout    Exercises     Shoulder Instructions      Home Living Family/patient expects to be discharged to:: Skilled nursing facility Living Arrangements: Non-relatives/Friends Available Help at Discharge: Friend(s);Available 24 hours/day Type of Home: House Home Access:  Stairs to enter [employer] of Steps: 3 Entrance Stairs-Rails: None Home Layout: One level     Bathroom Shower/Tub: Occupational psychologist: Standard     Home Equipment: None   Additional Comments: When asked who she has at home, pt stated "I just don't know right now."      Prior Functioning/Environment Level of Independence: Needs assistance;Independent        Comments: Independent prior to recent episode.Recently at [facility] and requiring assistance for ADLs and mobility        OT Problem List: Decreased strength;Decreased range of motion;Decreased activity tolerance;Impaired balance (sitting and/or standing);Decreased cognition;Decreased safety awareness;Decreased knowledge of use of DME or AE;Decreased knowledge of precautions;Pain      OT Treatment/Interventions: Self-care/ADL training;Therapeutic exercise;Energy conservation;DME and/or AE instruction;Therapeutic activities;Patient/family education    OT Goals(Current goals can be found in the care plan section) Acute Rehab OT Goals Patient Stated Goal: "I want to walk again." OT Goal Formulation: With patient Time For Goal Achievement: 02/26/18 Potential to Achieve Goals: Good  OT Frequency: Min 2X/week   Barriers to D/C:            Co-evaluation              AM-PAC OT "6 Clicks" Daily Activity     Outcome Measure Help from another person eating meals?: None Help from another person taking care of personal grooming?: A Little Help from another person toileting, which includes using toliet, bedpan, or urinal?: A Lot Help from another person bathing (including washing, rinsing, drying)?: A Lot Help from another person to put on and taking off regular upper body clothing?: A Little Help from  another person to put on and taking off regular lower body clothing?: A Lot 6 Click Score: 16   End of Session Equipment Utilized During Treatment: Gait belt Nurse Communication: Mobility  status  Activity Tolerance: Patient limited by fatigue;Patient limited by pain Patient left: in bed;with call bell/phone within reach;with bed alarm set  OT Visit Diagnosis: Unsteadiness on feet (R26.81);Other abnormalities of gait and mobility (R26.89);Muscle weakness (generalized) (M62.81);Pain Pain - part of body: (Chest)                Time: 1411-1430 OT Time Calculation (min): 19 min Charges:  OT General Charges $OT Visit: 1 Visit OT Evaluation $OT Eval Moderate Complexity: [place], OTR/L Acute Rehab Pager: [phone] Office: [place] 02/12/2018, 3:57 PM

## 2018-02-12 NOTE — Progress Notes (Signed)
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  She was hospitalized from 12/7-12/29 at [facility] with pathological acute anterior trochanteric fracture of the left hip requiring surgical intervention on 12/8.  During that time a CT angiogram of the chest, abdomen and pelvis showed multiple tumors involving the kidneys, pancreas, liver, lung, right adrenal, peritoneum, retroperitoneum and bone with lumbar vertebral involvement.  Bone biopsy done during surgery showed finding of small cell cancer likely lung primary. Oncology was consulted during hospitalization and offered chemotherapy and radiation however patient refused and wanted to return back with her family in [place] and get worked up.  She was also found to have large pericardial effusion and underwent TEE on 12/12 showing inconsistent pericardial effusion with no signs of RV collapse, compression.  Repeat echo on 12/28 showed large pericardial effusion with elevated intrapericardial pressure and early tamponade physiology.  Patient however refused intervention and was sent to skilled nursing facility. However she started having worsening shortness of breath and pleuritic chest pain radiating to her back on 12/30.  Also complained of leg swelling and shin pain.  She was presented at end of hospital ED where she was tachycardic in low 100s, tachypneic with stable blood pressure and afebrile.  O2 sat of 98% on 2 L.  Blood work showed mild leukocytosis, hemoglobin of 11.6, platelets of 577, K of 3.3, normal renal function.  CT angiogram of the chest showed extensive tumor in the right lower lobe with bulky thoracic lymphadenopathy, progressive metastases along the right heart border with a large right-sided pericardial effusion and small-to-moderate right pleural effusion, small  left pleural effusion, multifocal lytic osseous metastases.  Patient transferred to [facility] for cardiology evaluation and pericardial window but not a candidate due to her widespread metastatic disease and obstruction of the Right Mainstem Bronchus and Partial Obstruction of the Left. Have stopped the IV Lasix because of concern of Hastening Pre-load reduction and worsening of Pericardial Effusion causing cardiac tamponade but because CXR showed mild CHF will try po Lasix. Discussed with the patient about what could happen with pre-load reduction and Cardiac Tamponade and hastening demise and she is ok with po Lasix for now.   Assessment & Plan:   Principal Problem:   Malignant pericardial effusion (HCC) Active Problems:   SOB (shortness of breath)   Small cell lung cancer (HCC)   S/P pericardiocentesis   Palliative care by specialist   Goals of care, counseling/discussion   Cardiac tamponade   Cancer related pain   Anxiety state   Protein-calorie malnutrition, severe   Palliative care encounter  Recurrent Pericardial Effusion ([place])  -2D echo showed large pericardial effusion with tamponade physiology.   -CVTS consulted by cardiology who reviewed the CT scan which shows total obstruction of the right mainstem bronchus and subtotal obstruction of the left mainstem bronchus.   -Dr. Darcey Nora recommended she is not a candidate for pericardial window since once she gets intubated she would be ventilator dependent. -Pericardiocentesis  performed on 12/31 with 900 cc fluid removed.   -Tolerated procedure well.  Fluid sent for studies, no growth so far.  Cytology showed No malignant cells identified -Hasd about 60 cc output in the drain today.   -Now has lower leg edema.  Also has abdominal distention which is new for the past 2 days but is improved -D/C'd  IV Lasix 40 mg Daily been concern for causing preload reduction and hastening cardiac decompensation in the setting of likely cardiac  tamponade -Repeat ECHO showed Moderate to Large Pericardial Effusion today and showed that when compared to 1231 the effusion has reaccumulated and is smaller than prior to pericardiocentesis but larger than at the end of procedure -Cardiology removed Drain today as they cannot leave it in due to risk of infection.  And cardiology feels that because of her terminal illness and her reaccumulating pericardial effusion with minimal drainage it is futile at this point given her extremely poor prognosis -Patient is in very much denial about this illness and has not come to grips with her mortality  -Transfer out to telemetry.  Cardiology following. -Cardiology is recommending hospice and palliative care working with patient patient does not want to speak with palliative care; cardiology feels she has no other options and feels like she will decompensate in the setting of likely cardiac tamponade and states that they will not repeat a pericardiocentesis -Disdontinued IV Lasix at this time because of her worsening and reaccumulating pericardial effusion quickly and we do not want to culture due to her preload reduction that would cause hastening of decompensation in the setting of likely cardiac tamponade that will happen; her prognosis is extremely poor; Will transition to po Lasix as CXR showed interval decrease size of cardiac silhouette, [place]a Pleural Effusions with R > L and Pulmonary Vascular Congestion -Oncology evaluated and because she has this incurable condition treatment is all palliative and Dr. Earlie Server gave her the option of palliative care and hospice referral versus consideration of palliative systemic chemotherapy with carboplatin and etoposide and Tecntriq however does not want any current treatment at this time as she became visibly upset  -Will continue Palliative Conversations as patient remains FULL CODE -Patient wishes to go to SNF and will consider getting second opinion eleswhere; have  asked PT OT to reevaluate given that she cannot go back to her old facility; PT evaluated and OT to Re-evaluate -She will need oxygen at discharge along with other therapies she desaturated to walk screen yesterday and will need 2 L oxygen while ambulating with a rolling walker with 5 inch wheels and a 3 and 1  Metastatic Small Cell Lung Cancer ([place]) -Multiple visceral and osseous metastases as outlined above.   -Oncology consulted.  Dr. Julien Nordmann saw patient on 1/1 and discussed at length with her regarding her prognosis and treatment options.   -She has incurable disease and treatment will only be palliative.   -He gave her the option of palliative care with hospice referral versus considering palliative systemic chemotherapy with carboplatin, etoposide and Tecentriq.  -Her prognosis without treatment would be <3 months and with treatment the average survival could be up to 9 months depending upon how she responds to the treatment. -Patient would like to get her treatment in [place] close to home and Dr. Worthy Flank office will arrange appointment with oncologist in [place]. -Palliative care for goals of care discussion. -Patient has not come to grips with her terminal illness and becomes upset when discussing her mortality and prognosis -As above -We will discontinue IV Lasix given concern of preload reduction but start po Lasix 40 mg given CXR Findings  Acute Respiratory Failure with Hypoxia -Improved after pericardiocentesis but fluid has recaccumulated.  Now back on oxygen when ambulating  -Home O2 screen done and she needs at least 2 L -Because her fluid accumulated very quickly this prognosis very poor and  it is again recommended to her to go to hospice however she refuses -CXR this AM showed interval decrease as the cardiac silhouette but there is bilateral pleural effusions noted right greater than left and there is also pulmonary vascular congestion identified.  The previous right upper  lobe asymmetric airspace opacity resolved in the interval -Will Try p.o. Lasix with the understanding that the this could still cause pre-load reduction in the setting of increased Cardiac Pressures causing Tamponade and ultimate demise -Will need oxygen at discharge to skilled nursing facility  Severe Protein Calorie malnutrition/cachexia -Nutrition consult appreciated -Recommending Liberalizeing Diet   Hypokalemia -Improved. K+ was 3.7 -Continue to Monitor and Replete as Necessary -Repeat CMP in AM   Recent Left Hip Fracture -Status post repair about 3 weeks back.  Reports she was ambulating with a walker prior to coming to the hospital.   -PT ordered now that pericardial drain removed and recommending SNF  Leukocytosis -Fluctuating WBC went from 14.8 -> 12.5 -> 10.9 -> 10.1 -> 11.5 -Continue to Monitor for S/Sx of Infection -Repeat CBC in AM  Thrombocytosis -In the setting of Malignancy -Patient's Platelet Count went from 457 -> 451 -> 488 -> 500 -Continue to Monitor  -Repeat CBC in AM    Hypophosphatemia -Patient's Phos Level this AM was 2.6 -Replete with po KPhos Neutral 500 mg po BID x2 doses yesterday -Continue to Monitor and Replete as Necessary -Repeat Phos Level in AM   Normocytic Anemia -Patient's Hb/Hct went from 10.6/35.2 -> 11.0/36.2 -> 11.5/38.1 -> 10.6/33.7 -> 10.6/34.2 -In the Setting of Malignancy -Repeat CBC in AM   DVT prophylaxis: Heparin 5,000 units sq q8h Code Status: FULL CODE Family Communication: No family present at bedside Disposition Plan: It is recommended that she go to Hospice as Cardiology has no options for her. Her prognosis is extremely poor but patient does not want to talk to [place] so she will be D/C'd back to SNF  Consultants:   Palliative Care  Cardiology   Procedures: ECHOCARDIOGRAM 02/07/18 ------------------------------------------------------------------- Study Conclusions  - Left ventricle:  Systolic function was vigorous. The estimated   ejection fraction was in the range of 65% to 70%. - Pericardium, extracardiac: A large pericardial effusion was   identified circumferential to the heart. There was right atrial   and right ventricular chamber collapse. Features were consistent   with tamponade physiology.  Impressions:  - Post pericardiocentesis images show resolution of pericardial   effusion.  [place] 02/09/2018 ------------------------------------------------------------------- Study Conclusions  - Left ventricle: Systolic function was normal. The estimated   ejection fraction was in the range of 60% to 65%. - Pulmonary arteries: PA peak pressure: 34 mm Hg (S). - Pericardium, extracardiac: A moderate to large pericardial   effusion was identified.  Impressions:  - Limited study to reassess pericardial effusion; normal LV   function; moderate to large pericardial effusion; mild RA   collapse; no RV diastolic collapse; IVC not dilated; when   compared to 02/07/18, effusion has reaccumulated (smaller than   prior to pericardiocentesis but larger than at end of procedure).   Antimicrobials:  Anti-infectives (From admission, onward)   None     Subjective: Seen and examined at bedside was sleeping awoken from sleep.  Denied any chest pain, lightheadedness or dizziness.  Denied any shortness of breath however she did need home O2 on ambulation yesterday with nursing and PT.  She currently not wearing oxygen and states that her abdomen feels less distended but states she gets very  short of breath when she tries to move around.  Still awaiting skilled nursing facility placement was asking about update about going to the facility today  Objective: Vitals:   02/11/18 1428 02/11/18 2100 02/12/18 0616 02/12/18 0757  BP: 102/70 106/84 119/84   Pulse: (!) 107 (!) 115 (!) 108 (!) 109  Resp: (!) 28 (!) 26 19 13   Temp: 98.1 F (36.7 C) 98.4 F (36.9 C)      TempSrc: Oral Oral    SpO2: 100% 100% 96% 96%  Weight:   46 kg   Height:        Intake/Output Summary (Last 24 hours) at 02/12/2018 1025 Last data filed at 02/11/2018 2200 Gross per 24 hour  Intake 60 ml  Output 800 ml  Net -740 ml   Filed Weights   02/06/18 2220 02/12/18 0616  Weight: 46.2 kg 46 kg   Examination: Physical Exam:  Constitutional: Thin, chronically ill-appearing Caucasian female currently no acute distress looks little bit more comfortable today Eyes: Conjunctive are normal.  Sclera anicteric ENMT: External ears and nose appear normal.  Grossly normal hearing.  Mucous members are moist Neck: Appears supple no evidence of JVD Respiratory: Diminished auscultation bilaterally no appreciable wheezing, rales, rhonchi but does have some mild crackles.  She is not wearing supplemental oxygen via nasal cannula this morning when I examined her Cardiovascular: Diminished heart sounds but regular rate and rhythm.  Has lower extremity edema noted. Abdomen: Soft, nontender, distended slightly not as much as yesterday.  Bowel sounds present all 4 quadrants GU: Deferred Musculoskeletal: No contractures or cyanosis.  No joint deformity noted in upper extremities Skin: Skin is warm and dry with no appreciable rashes or lesions on limited skin evaluation pericardial drain has been removed. Neurologic: Cranial nerves II through XII grossly intact with no appreciable focal deficits.   Psychiatric: Normal judgment and insight.  Patient is awake alert and oriented x3.  Still appears anxious but more calm today  Data Reviewed: I have personally reviewed following labs and imaging studies  CBC: Recent Labs  Lab 02/08/18 0051 02/09/18 0253 02/10/18 0945 02/11/18 0400 02/12/18 0443  WBC 14.8* 12.5* 10.9* 10.1 11.5*  NEUTROABS  --   --  8.8* 7.6 8.5*  HGB 10.6* 11.0* 11.5* 10.6* 10.6*  HCT 35.2* 36.2 38.1 33.7* 34.2*  MCV 87.8 87.9 87.2 87.5 88.4  PLT 515* 457* 451* 488* 500*    Basic Metabolic Panel: Recent Labs  Lab 02/07/18 0353 02/08/18 0051 02/08/18 0915 02/10/18 0303 02/10/18 0945 02/11/18 0400 02/12/18 0443  NA 144  --  143 140  --  139 140  K 3.3*  --  4.2 3.8  --  3.8 3.7  CL 105  --  110 106  --  105 107  CO2 27  --  24 25  --  26 26  GLUCOSE 105*  --  122* 106*  --  107* 118*  BUN 13  --  9 10  --  6 13  CREATININE 0.50 0.41* 0.43* 0.41*  --  0.47 0.47  CALCIUM 8.4*  --  7.9* 7.7*  --  7.6* 8.1*  MG  --   --   --   --  1.8 1.8 1.9  PHOS  --   --   --   --  2.1* 2.0* 2.6   GFR: Estimated Creatinine Clearance: 55 mL/min (by C-G formula based on SCr of 0.47 mg/dL). Liver Function Tests: Recent Labs  Lab 02/11/18 0400 02/12/18 0443  AST 24 25  ALT 16 15  ALKPHOS 75 75  BILITOT 0.1* 0.3  PROT 5.4* 5.7*  ALBUMIN 2.1* 2.1*   No results for input(s): LIPASE, AMYLASE in the last 168 hours. No results for input(s): AMMONIA in the last 168 hours. Coagulation Profile: No results for input(s): INR, PROTIME in the last 168 hours. Cardiac Enzymes: Recent Labs  Lab 02/07/18 0353 02/07/18 0755 02/07/18 1639  TROPONINI <0.03 <0.03 0.03*   BNP (last 3 results) No results for input(s): PROBNP in the last 8760 hours. HbA1C: No results for input(s): HGBA1C in the last 72 hours. CBG: No results for input(s): GLUCAP in the last 168 hours. Lipid Profile: No results for input(s): CHOL, HDL, LDLCALC, TRIG, CHOLHDL, LDLDIRECT in the last 72 hours. Thyroid Function Tests: No results for input(s): TSH, T4TOTAL, FREET4, T3FREE, THYROIDAB in the last 72 hours. Anemia Panel: No results for input(s): VITAMINB12, FOLATE, FERRITIN, TIBC, IRON, RETICCTPCT in the last 72 hours. Sepsis Labs: No results for input(s): PROCALCITON, LATICACIDVEN in the last 168 hours.  Recent Results (from the past 240 hour(s))  Gram stain     Status: None   Collection Time: 02/07/18  1:17 PM  Result Value Ref Range Status   Specimen Description PERITONEAL  Final    Special Requests FLUID  Final   Gram Stain   Final    RARE WBC PRESENT, PREDOMINANTLY MONONUCLEAR NO ORGANISMS SEEN Performed at [facility], [address], [place], [place] [address]    Report Status 02/07/2018 FINAL  Final  Culture, body fluid-bottle     Status: None   Collection Time: 02/07/18  3:17 PM  Result Value Ref Range Status   Specimen Description PERITONEAL  Final   Special Requests NONE  Final   Culture NO GROWTH 5 DAYS  Final   Report Status 02/12/2018 FINAL  Final  MRSA PCR Screening     Status: None   Collection Time: 02/07/18  4:22 PM  Result Value Ref Range Status   MRSA by PCR NEGATIVE NEGATIVE Final    Comment:        The GeneXpert MRSA Assay (FDA approved for NASAL specimens only), is one component of a comprehensive MRSA colonization surveillance program. It is not intended to diagnose MRSA infection nor to guide or monitor treatment for MRSA infections. Performed at [facility], [place] [address] [place], [place] [address]     Radiology Studies: Dg Chest Port 1 View  Result Date: 02/12/2018 CLINICAL DATA:  Shortness of breath and COPD EXAM: PORTABLE CHEST 1 VIEW COMPARISON:  02/07/2018 FINDINGS: Interval decrease size of cardiac silhouette. Bilateral pleural effusions are noted, right greater in left. There is pulmonary vascular congestion identified. Previous right upper lobe asymmetric airspace opacity has resolved in the interval. IMPRESSION: 1. Mild congestive heart failure. 2. Decrease in size of cardiac silhouette Electronically Signed   By: Kerby Moors M.D.   On: 02/12/2018 07:30    Scheduled Meds: . heparin  5,000 Units Subcutaneous Q8H  . morphine  15 mg Oral Q8H  . potassium & sodium phosphates  1 packet Oral BID WC  . sodium chloride flush  3 mL Intravenous Q12H   Continuous Infusions: . sodium chloride      LOS: 6 days   Kerney Elbe, DO [facility] PAGER is on AMION  If 7PM-7AM, please contact  night-coverage [email or web address] Password [facility] 02/12/2018, 9:43 AM

## 2018-02-13 ENCOUNTER — Inpatient Hospital Stay (HOSPITAL_COMMUNITY): Payer: BLUE CROSS/BLUE SHIELD

## 2018-02-13 LAB — COMPREHENSIVE METABOLIC PANEL
ALT: 17 U/L (ref 0–44)
AST: 25 U/L (ref 15–41)
Albumin: 2.1 g/dL — ABNORMAL LOW (ref 3.5–5.0)
Alkaline Phosphatase: 82 U/L (ref 38–126)
Anion gap: 7 (ref 5–15)
BUN: 12 mg/dL (ref 6–20)
CO2: 25 mmol/L (ref 22–32)
CREATININE: 0.45 mg/dL (ref 0.44–1.00)
Calcium: 8.5 mg/dL — ABNORMAL LOW (ref 8.9–10.3)
Chloride: 108 mmol/L (ref 98–111)
GFR calc Af Amer: >60 mL/min (ref >60)
GFR calc non Af Amer: >60 mL/min (ref >60)
GLUCOSE: 103 mg/dL — AB (ref 70–99)
Potassium: 4 mmol/L (ref 3.5–5.1)
Sodium: 140 mmol/L (ref 135–145)
Total Bilirubin: 0.3 mg/dL (ref 0.3–1.2)
Total Protein: 5.8 g/dL — ABNORMAL LOW (ref 6.5–8.1)

## 2018-02-13 LAB — CBC WITH DIFFERENTIAL/PLATELET
Abs Immature Granulocytes: 0.03 10*3/uL (ref 0.00–0.07)
Basophils Absolute: 0.2 10*3/uL — ABNORMAL HIGH (ref 0.0–0.1)
Basophils Relative: 1 %
Eosinophils Absolute: 0.3 10*3/uL (ref 0.0–0.5)
Eosinophils Relative: 2 %
HEMATOCRIT: 34.3 % — AB (ref 36.0–46.0)
HEMOGLOBIN: 10.7 g/dL — AB (ref 12.0–15.0)
Immature Granulocytes: 0 %
LYMPHS PCT: 13 %
Lymphs Abs: 1.6 10*3/uL (ref 0.7–4.0)
MCH: 27.6 pg (ref 26.0–34.0)
MCHC: 31.2 g/dL (ref 30.0–36.0)
MCV: 88.6 fL (ref 80.0–100.0)
Monocytes Absolute: 1 10*3/uL (ref 0.1–1.0)
Monocytes Relative: 9 %
NEUTROS ABS: 8.6 10*3/uL — AB (ref 1.7–7.7)
Neutrophils Relative %: 75 %
Platelets: 536 10*3/uL — ABNORMAL HIGH (ref 150–400)
RBC: 3.87 MIL/uL (ref 3.87–5.11)
RDW: 15.4 % (ref 11.5–15.5)
WBC: 11.6 10*3/uL — ABNORMAL HIGH (ref 4.0–10.5)
nRBC: 0 % (ref 0.0–0.2)

## 2018-02-13 LAB — PHOSPHORUS: Phosphorus: 2.6 mg/dL (ref 2.5–4.6)

## 2018-02-13 LAB — MAGNESIUM: Magnesium: 1.9 mg/dL (ref 1.7–2.4)

## 2018-02-13 NOTE — Social Work (Signed)
Patient does now have a bed offer from [facility] in [place]. Other [facility] facilities did not offer a bed. [facility] reviewed updated notes and offered a bed. They will start the authorization request with patient's [facility] today. Patient will need [employer] authorization before admitting to the facility.  Updated patient on the above at bedside. Patient is agreeable to [facility] if they are able to obtain auth. Patient is anxious about waiting for auth, doesn't want to wait more than two days.   CSW will follow and support.  Estanislado Emms, LCSW [phone]

## 2018-02-13 NOTE — Clinical Social Work Note (Signed)
Clinical Social Work Assessment  Patient Details  Name: Suzanne Carroll MRN: [id number] Date of Birth: January 27, 1961  Date of referral:  02/13/18               Reason for consult:  Facility Placement, Discharge Planning                Permission sought to share information with:  Facility Art therapist granted to share information::     Name::        Agency::  SNFs in [place]  Relationship::     Contact Information:     Housing/Transportation Living arrangements for the past 2 months:  Single Family Home Source of Information:  Patient Patient Interpreter Needed:  None Criminal Activity/Legal Involvement Pertinent to Current Situation/Hospitalization:  No - Comment as needed Significant Relationships:  Friend Lives with:  Self Do you feel safe going back to the place where you live?  Yes Need for family participation in patient care:  No (Coment)  Care giving concerns: PT recommending SNF. Patient recently at [facility] in [place] but the facility will not accept patient back.   Social Worker assessment / plan: CSW met with patient at bedside. Patient alert and oriented. CSW discussed disposition planning. Patient indicated she wants to go to SNF for rehab for her hip. Patient is upset that the focus has been on her cancer diagnosis and not rehab. Patient wants to try to find another SNF in [place] that will accept her.  CSW explained referral and insurance authorization process. Patient will need [employer] authorization prior to admitting to a facility. Patient insisted the facility must be in [place]. CSW sent out initial referrals. Will provide bed offers when available. Facility of choice will initiate [employer] authorization when identified.   CSW will follow and support with discharge planning.  Employment status:    Insurance information:  Managed Care(BCBS) PT Recommendations:  [place] / Referral to community resources:   [place]  Patient/Family's Response to care: Patient appreciative of care.  Patient/Family's Understanding of and Emotional Response to Diagnosis, Current Treatment, and Prognosis: Patient does not want to discuss her cancer diagnosis. Patient only wants to talk about rehab for her hip.  Emotional Assessment Appearance:  Appears stated age Attitude/Demeanor/Rapport:  Engaged Affect (typically observed):  Defensive, In denial, Frustrated Orientation:  Oriented to Self, Oriented to  Time, Oriented to Place, Oriented to Situation Alcohol / Substance use:  Not Applicable Psych involvement (Current and /or in the community):  No (Comment)  Discharge Needs  Concerns to be addressed:  Discharge Planning Concerns, Care Coordination Readmission within the last 30 days:  Yes Current discharge risk:  Physical Impairment Barriers to Discharge:  Continued Medical Work up, [place], [place] 02/13/2018, 12:35 PM

## 2018-02-13 NOTE — Progress Notes (Signed)
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  She was hospitalized from 12/7-12/29 at [facility] with pathological acute anterior trochanteric fracture of the left hip requiring surgical intervention on 12/8.  During that time a CT angiogram of the chest, abdomen and pelvis showed multiple tumors involving the kidneys, pancreas, liver, lung, right adrenal, peritoneum, retroperitoneum and bone with lumbar vertebral involvement.  Bone biopsy done during surgery showed finding of small cell cancer likely lung primary. Oncology was consulted during hospitalization and offered chemotherapy and radiation however patient refused and wanted to return back with her family in [place] and get worked up.  She was also found to have large pericardial effusion and underwent TEE on 12/12 showing inconsistent pericardial effusion with no signs of RV collapse, compression.  Repeat echo on 12/28 showed large pericardial effusion with elevated intrapericardial pressure and early tamponade physiology.  Patient however refused intervention and was sent to skilled nursing facility. However she started having worsening shortness of breath and pleuritic chest pain radiating to her back on 12/30.  Also complained of leg swelling and shin pain.  She was presented at end of hospital ED where she was tachycardic in low 100s, tachypneic with stable blood pressure and afebrile.  O2 sat of 98% on 2 L.  Blood work showed mild leukocytosis, hemoglobin of 11.6, platelets of 577, K of 3.3, normal renal function.  CT angiogram of the chest showed extensive tumor in the right lower lobe with bulky thoracic lymphadenopathy, progressive metastases along the right heart border with a large right-sided pericardial effusion and small-to-moderate right pleural effusion, small  left pleural effusion, multifocal lytic osseous metastases.  Patient transferred to [facility] for cardiology evaluation and pericardial window but not a candidate due to her widespread metastatic disease and obstruction of the Right Mainstem Bronchus and Partial Obstruction of the Left. Have stopped the IV Lasix because of concern of Hastening Pre-load reduction and worsening of Pericardial Effusion causing cardiac tamponade but because CXR showed mild CHF will try po Lasix. Discussed with the patient about what could happen with pre-load reduction and Cardiac Tamponade and hastening demise and she is ok with po Lasix for now.   Assessment & Plan:   Principal Problem:   Malignant pericardial effusion (HCC) Active Problems:   SOB (shortness of breath)   Small cell lung cancer (HCC)   S/P pericardiocentesis   Palliative care by specialist   Goals of care, counseling/discussion   Cardiac tamponade   Cancer related pain   Anxiety state   Protein-calorie malnutrition, severe   Palliative care encounter  Recurrent Pericardial Effusion ([place])  -2D echo showed large pericardial effusion with tamponade physiology.   -CVTS consulted by cardiology who reviewed the CT scan which shows total obstruction of the right mainstem bronchus and subtotal obstruction of the left mainstem bronchus.   -Dr. Darcey Nora recommended she is not a candidate for pericardial window since once she gets intubated she would be ventilator dependent. -Pericardiocentesis  performed on 12/31 with 900 cc fluid removed.   -Tolerated procedure well.  Fluid sent for studies, no growth so far. Cytology showed No malignant cells identified -Now has lower leg edema.  Also has abdominal distention which is new for the past 2 days but is improved -D/C'd IV Lasix 40 mg Daily been concern for causing preload reduction and  hastening cardiac decompensation in the setting of likely cardiac tamponade -Repeat ECHO showed Moderate to Large  Pericardial Effusion today and showed that when compared to 1231 the effusion has reaccumulated and is smaller than prior to pericardiocentesis but larger than at the end of procedure -Cardiology removed Drain today as they cannot leave it in due to risk of infection.  And cardiology feels that because of her terminal illness and her reaccumulating pericardial effusion with minimal drainage it is futile at this point given her extremely poor prognosis -Patient is in very much denial about this illness and has not come to grips with her mortality  -Transfer out to telemetry.  Cardiology following. -Cardiology is recommending hospice and palliative care working with patient patient does not want to speak with palliative care; cardiology feels she has no other options and feels like she will decompensate in the setting of likely cardiac tamponade and states that they will not repeat a pericardiocentesis -Disdontinued IV Lasix at this time because of her worsening and reaccumulating pericardial effusion quickly and we do not want to culture due to her preload reduction that would cause hastening of decompensation in the setting of likely cardiac tamponade that will happen; her prognosis is extremely poor; Will transition to po Lasix as CXR showed interval decrease size of cardiac silhouette, Bilatera Pleural Effusions with R > L and Pulmonary Vascular Congestion -Oncology evaluated and because she has this incurable condition treatment is all palliative and Dr. Earlie Server gave her the option of palliative care and hospice referral versus consideration of palliative systemic chemotherapy with carboplatin and etoposide and Tecntriq however does not want any current treatment at this time as she became visibly upset  -Will continue Palliative Conversations as patient remains FULL CODE -Patient wishes to go to SNF and will consider getting second opinion eleswhere; have asked PT OT to reevaluate given that she cannot  go back to her old facility; PT evaluated and OT to Re-evaluate and this has been done -Social Work in the process of assisting with placement -She will need oxygen at discharge along with other therapies she desaturated to walk screen yesterday and will need 2 L oxygen while ambulating with a rolling walker with 5 inch wheels and a 3 and 1  Metastatic Small Cell Lung Cancer ([place]) -Multiple visceral and osseous metastases as outlined above.   -Oncology consulted.  Dr. Julien Nordmann saw patient on 1/1 and discussed at length with her regarding her prognosis and treatment options.   -She has incurable disease and treatment will only be palliative.   -He gave her the option of palliative care with hospice referral versus considering palliative systemic chemotherapy with carboplatin, etoposide and Tecentriq.  -Her prognosis without treatment would be <3 months and with treatment the average survival could be up to 9 months depending upon how she responds to the treatment. -Patient would like to get her treatment in [place] close to home and Dr. Worthy Flank office will arrange appointment with oncologist in [place]. -Palliative care for goals of care discussion. -Patient has not come to grips with her terminal illness and becomes upset when discussing her mortality and prognosis -As above -We will discontinue IV Lasix given concern of preload reduction but start po Lasix 40 mg given CXR Findings -Will need to follow up with Oncology for Palliative Chemotherapy if she so desires; Today she did not want to even think about her cancer and was focused on her hip and is in denial   Acute Respiratory Failure with  Hypoxia -Improved after pericardiocentesis but fluid has recaccumulated.  Now back on oxygen when ambulating  -Home O2 screen done and she needs at least 2 L -Because her fluid accumulated very quickly this prognosis very poor and it is again recommended to her to go to hospice however she  refuses -CXR this AM showed interval decrease as the cardiac silhouette but there is bilateral pleural effusions noted right greater than left and there is also pulmonary vascular congestion identified.  The previous right upper lobe asymmetric airspace opacity resolved in the interval -Will Try p.o. Lasix with the understanding that the this could still cause pre-load reduction in the setting of increased Cardiac Pressures causing Tamponade and ultimate demise -Will need oxygen at discharge to skilled nursing facility  Severe Protein Calorie malnutrition/cachexia -Nutrition consult appreciated -Recommending Liberalizeing Diet   Hypokalemia -Improved. K+ was 4.0 -Continue to Monitor and Replete as Necessary -Repeat CMP in AM   Recent Left Hip Fracture -Status post repair about 3 weeks back.  Reports she was ambulating with a walker prior to coming to the hospital.   -PT ordered now that pericardial drain removed and recommending SNF and attempting to get to SNF today -Patient was very agitated this morning and is wanted to leave [place] as she is not getting possible I discussed with her about placement and she is agreeable to stay and have social worker send out referrals to try and get insurance approval.  Leukocytosis -Fluctuating WBC went from 14.8 -> 12.5 -> 10.9 -> 10.1 -> 11.5 -> 11.6 -Continue to Monitor for S/Sx of Infection -Repeat CBC in AM  Thrombocytosis -In the setting of Malignancy -Patient's Platelet Count went from 457 -> 451 -> 488 -> 500 -> 536 -Continue to Monitor  -Repeat CBC in AM    Hypophosphatemia -Patient's Phos Level this AM was 2.6 -Continue to Monitor and Replete as Necessary -Repeat Phos Level in AM   Normocytic Anemia -Patient's Hb/Hct stable and is 10.7/34.3 -In the Setting of Malignancy -Repeat CBC in AM   DVT prophylaxis: Heparin 5,000 units sq q8h Code Status: FULL CODE Family Communication: No family present at  bedside Disposition Plan: It is recommended that she go to Hospice as Cardiology has no options for her. Her prognosis is extremely poor but patient does not want to talk to [place] so she will be D/C'd back to SNF  Consultants:   Palliative Care  Cardiology   Procedures: ECHOCARDIOGRAM 02/07/18 ------------------------------------------------------------------- Study Conclusions  - Left ventricle: Systolic function was vigorous. The estimated   ejection fraction was in the range of 65% to 70%. - Pericardium, extracardiac: A large pericardial effusion was   identified circumferential to the heart. There was right atrial   and right ventricular chamber collapse. Features were consistent   with tamponade physiology.  Impressions:  - Post pericardiocentesis images show resolution of pericardial   effusion.  [place] 02/09/2018 ------------------------------------------------------------------- Study Conclusions  - Left ventricle: Systolic function was normal. The estimated   ejection fraction was in the range of 60% to 65%. - Pulmonary arteries: PA peak pressure: 34 mm Hg (S). - Pericardium, extracardiac: A moderate to large pericardial   effusion was identified.  Impressions:  - Limited study to reassess pericardial effusion; normal LV   function; moderate to large pericardial effusion; mild RA   collapse; no RV diastolic collapse; IVC not dilated; when   compared to 02/07/18, effusion has reaccumulated (smaller than   prior to pericardiocentesis but larger than at  end of procedure).   Antimicrobials:  Anti-infectives (From admission, onward)   None     Subjective: Seen and examined at bedside was very agitated.  She is upset about not going to a skilled nursing facility yet and was wanting to leave [place].  I discussed with her that her level of care was higher than that degree evaluations for skilled nursing facility  placement.  Social worker working close with the patient and send out referrals.  Patient denies any chest pain, shortness of breath but her main complaint was that she was wanting to telemetry off.  No other concerns or complaints at this time and she feels okay but was frustrated and wants to leave.  Objective: Vitals:   02/12/18 1700 02/12/18 2127 02/13/18 0538 02/13/18 1124  BP:  117/79 115/89 (!) 118/92  Pulse:  (!) 122 (!) 110 (!) 119  Resp: (!) 22 17 (!) 24 (!) 22  Temp:  98.9 F (37.2 C) 98.6 F (37 C)   TempSrc:  Oral Oral   SpO2: 95% 98% 97% 100%  Weight:      Height:        Intake/Output Summary (Last 24 hours) at 02/13/2018 1236 Last data filed at 02/12/2018 2200 Gross per 24 hour  Intake 600 ml  Output 300 ml  Net 300 ml   Filed Weights   02/06/18 2220 02/12/18 0616  Weight: 46.2 kg 46 kg   Examination: Physical Exam:  Constitutional: Very thin and chronically ill-appearing Caucasian female currently who is very agitated and appears uncomfortable and wanting to leave Eyes: Sclera anicteric.  Lids and conjunctive are normal ENMT: External ears and nose appear normal.  Grossly normal hearing. Respiratory: Diminished auscultation bilaterally no appreciable wheezing, rales, rhonchi.  She was not wearing any supplemental oxygen via nasal cannula but did have some mild crackles on auscultation Cardiovascular: Diminished heart sounds but has a tachycardic rate but regular rhythm.  Has some lower extremity edema noted Abdomen: Soft, nontender, distended slightly.  Bowel sounds present GU: Deferred Musculoskeletal: No contractures or cyanosis.  No joint deformities in upper or lower extremities Skin: Is warm and dry no appreciable rashes or lesions limited skin evaluation. Neurologic: Cranial nerves II through XII gross intact no appreciable focal deficits Psychiatric: Anxious mood and affect and is visibly upset.  Patient is awake and alert and oriented x3  Data Reviewed:  I have personally reviewed following labs and imaging studies  CBC: Recent Labs  Lab 02/09/18 0253 02/10/18 0945 02/11/18 0400 02/12/18 0443 02/13/18 0649  WBC 12.5* 10.9* 10.1 11.5* 11.6*  NEUTROABS  --  8.8* 7.6 8.5* 8.6*  HGB 11.0* 11.5* 10.6* 10.6* 10.7*  HCT 36.2 38.1 33.7* 34.2* 34.3*  MCV 87.9 87.2 87.5 88.4 88.6  PLT 457* 451* 488* 500* 130*   Basic Metabolic Panel: Recent Labs  Lab 02/08/18 0915 02/10/18 0303 02/10/18 0945 02/11/18 0400 02/12/18 0443 02/13/18 0649  NA 143 140  --  139 140 140  K 4.2 3.8  --  3.8 3.7 4.0  CL 110 106  --  105 107 108  CO2 24 25  --  26 26 25   GLUCOSE 122* 106*  --  107* 118* 103*  BUN 9 10  --  6 13 12   CREATININE 0.43* 0.41*  --  0.47 0.47 0.45  CALCIUM 7.9* 7.7*  --  7.6* 8.1* 8.5*  MG  --   --  1.8 1.8 1.9 1.9  PHOS  --   --  2.1* 2.0* 2.6 2.6   GFR: Estimated Creatinine Clearance: 55 mL/min (by C-G formula based on SCr of 0.45 mg/dL). Liver Function Tests: Recent Labs  Lab 02/11/18 0400 02/12/18 0443 02/13/18 0649  AST 24 25 25   ALT 16 15 17   ALKPHOS 75 75 82  BILITOT 0.1* 0.3 0.3  PROT 5.4* 5.7* 5.8*  ALBUMIN 2.1* 2.1* 2.1*   No results for input(s): LIPASE, AMYLASE in the last 168 hours. No results for input(s): AMMONIA in the last 168 hours. Coagulation Profile: No results for input(s): INR, PROTIME in the last 168 hours. Cardiac Enzymes: Recent Labs  Lab 02/07/18 0353 02/07/18 0755 02/07/18 1639  TROPONINI <0.03 <0.03 0.03*   BNP (last 3 results) No results for input(s): PROBNP in the last 8760 hours. HbA1C: No results for input(s): HGBA1C in the last 72 hours. CBG: No results for input(s): GLUCAP in the last 168 hours. Lipid Profile: No results for input(s): CHOL, HDL, LDLCALC, TRIG, CHOLHDL, LDLDIRECT in the last 72 hours. Thyroid Function Tests: No results for input(s): TSH, T4TOTAL, FREET4, T3FREE, THYROIDAB in the last 72 hours. Anemia Panel: No results for input(s): VITAMINB12, FOLATE,  FERRITIN, TIBC, IRON, RETICCTPCT in the last 72 hours. Sepsis Labs: No results for input(s): PROCALCITON, LATICACIDVEN in the last 168 hours.  Recent Results (from the past 240 hour(s))  Gram stain     Status: None   Collection Time: 02/07/18  1:17 PM  Result Value Ref Range Status   Specimen Description PERITONEAL  Final   Special Requests FLUID  Final   Gram Stain   Final    RARE WBC PRESENT, PREDOMINANTLY MONONUCLEAR NO ORGANISMS SEEN Performed at [facility], [address], [place], [place] [address]    Report Status 02/07/2018 FINAL  Final  Culture, body fluid-bottle     Status: None   Collection Time: 02/07/18  3:17 PM  Result Value Ref Range Status   Specimen Description PERITONEAL  Final   Special Requests NONE  Final   Culture NO GROWTH 5 DAYS  Final   Report Status 02/12/2018 FINAL  Final  MRSA PCR Screening     Status: None   Collection Time: 02/07/18  4:22 PM  Result Value Ref Range Status   MRSA by PCR NEGATIVE NEGATIVE Final    Comment:        The GeneXpert MRSA Assay (FDA approved for NASAL specimens only), is one component of a comprehensive MRSA colonization surveillance program. It is not intended to diagnose MRSA infection nor to guide or monitor treatment for MRSA infections. Performed at [facility], [place] [address] [place], [place] [address]     Radiology Studies: Dg Chest Port 1 View  Result Date: 02/13/2018 CLINICAL DATA:  Shortness of breath. EXAM: PORTABLE CHEST 1 VIEW COMPARISON:  Chest x-ray 02/12/2018.  CT chest 02/06/2018. FINDINGS: The cardiac silhouette remains enlarged. This is consistent with the known effusion. Right lower lobe and pleural-based tumor is again seen. Right greater than left pleural effusions are similar. Bibasilar airspace disease is unchanged. IMPRESSION: 1. Stable or lying cardiomegaly. 2. Bibasilar airspace disease and effusions are stable. 3. Right hilar and lower lobe tumor. Electronically Signed   By:  San Morelle M.D.   On: 02/13/2018 07:25   Dg Chest Port 1 View  Result Date: 02/12/2018 CLINICAL DATA:  Shortness of breath and COPD EXAM: PORTABLE CHEST 1 VIEW COMPARISON:  02/07/2018 FINDINGS: Interval decrease size of cardiac silhouette. Bilateral pleural effusions are noted, right greater  in left. There is pulmonary vascular congestion identified. Previous right upper lobe asymmetric airspace opacity has resolved in the interval. IMPRESSION: 1. Mild congestive heart failure. 2. Decrease in size of cardiac silhouette Electronically Signed   By: Kerby Moors M.D.   On: 02/12/2018 07:30    Scheduled Meds: . furosemide  40 mg Oral Daily  . heparin  5,000 Units Subcutaneous Q8H  . morphine  15 mg Oral Q8H  . potassium & sodium phosphates  1 packet Oral BID WC  . sodium chloride flush  3 mL Intravenous Q12H   Continuous Infusions: . sodium chloride      LOS: 7 days   Kerney Elbe, DO [facility] PAGER is on AMION  If 7PM-7AM, please contact night-coverage [email or web address] Password [facility] 02/13/2018, 12:36 PM

## 2018-02-13 NOTE — [place] FL2 (Signed)
[place] MEDICAID FL2 LEVEL OF CARE SCREENING TOOL     IDENTIFICATION  Patient Name: Suzanne Carroll Birthdate: February 12, 1960 Sex: female Admission Date (Current Location): 02/06/2018  [facility] and [place] Number:  Herbalist and Address:  [facility] [place], [place] [address] [place], [place] [address]      Provider Number: [id number]  Attending Physician Name and Address:  Kerney Elbe, DO  Relative Name and Phone Number:  Corlis Hove [phone]    Current Level of Care: Hospital Recommended Level of Care: [place] Prior Approval Number:    Date Approved/Denied:   PASRR Number: [id number]  Discharge Plan: Home    Current Diagnoses: Patient Active Problem List   Diagnosis Date Noted  . Protein-calorie malnutrition, severe 02/10/2018  . Palliative care encounter   . Palliative care by specialist   . Goals of care, counseling/discussion   . Cardiac tamponade   . Cancer related pain   . Anxiety state   . S/P pericardiocentesis 02/08/2018  . SOB (shortness of breath) 02/07/2018  . Malignant pericardial effusion ([place]) 02/07/2018  . Small cell lung cancer ([place]) 02/07/2018    Orientation RESPIRATION BLADDER Height & Weight     Self, Time, Situation, Place  O2(nasal cannual 2L) Continent, External catheter Weight: 46 kg Height:  5\' 1"  (154.9 cm)  BEHAVIORAL SYMPTOMS/MOOD NEUROLOGICAL BOWEL NUTRITION STATUS      Continent Diet(please see DC summary)  AMBULATORY STATUS COMMUNICATION OF NEEDS Skin   Limited Assist Verbally Surgical wounds(closed incision upper mid abdomen)                       Personal Care Assistance Level of Assistance  Bathing, Feeding, Dressing Bathing Assistance: Limited assistance Feeding assistance: Independent Dressing Assistance: Limited assistance     Functional Limitations Info  Sight, Hearing, Speech Sight Info: Adequate Hearing Info: Adequate Speech Info: Adequate    SPECIAL CARE  FACTORS FREQUENCY  PT (By licensed PT), OT (By licensed OT)     PT Frequency: 5x/week OT Frequency: 5x/week            Contractures Contractures Info: Not present    Additional Factors Info  Code Status, Allergies Code Status Info: Full Allergies Info: Aspirin, Sulfa Antibiotics           Current Medications (02/13/2018):  This is the current hospital active medication list Current Facility-Administered Medications  Medication Dose Route Frequency Provider Last Rate Last Dose  . 0.9 %  sodium chloride infusion  250 mL Intravenous PRN Belva Crome, MD      . acetaminophen (TYLENOL) tablet 650 mg  650 mg Oral Q4H PRN Belva Crome, MD      . furosemide (LASIX) tablet 40 mg  40 mg Oral Daily Raiford Noble Coram, DO   40 mg at 02/12/18 1210  . heparin injection 5,000 Units  5,000 Units Subcutaneous Q8H Belva Crome, MD   5,000 Units at 02/13/18 [phone]  . ipratropium-albuterol (DUONEB) 0.5-2.5 (3) MG/3ML nebulizer solution 3 mL  3 mL Nebulization Q4H PRN Belva Crome, MD      . LORazepam (ATIVAN) injection 0.5 mg  0.5 mg Intravenous Q6H PRN Belva Crome, MD   0.5 mg at 02/11/18 2358  . morphine (MS CONTIN) 12 hr tablet 15 mg  15 mg Oral Q8H Dellinger, Marianne L, PA-C   15 mg at 02/13/18 0557  . morphine 2 MG/ML injection 2 mg  2 mg Intravenous  Q2H PRN Belva Crome, MD   2 mg at 02/13/18 0200  . ondansetron (ZOFRAN) injection 4 mg  4 mg Intravenous Q6H PRN Belva Crome, MD      . potassium & sodium phosphates (PHOS-NAK) 280-160-250 MG packet 1 packet  1 packet Oral BID WC Raiford Noble [place], [place]   1 packet at 02/12/18 [phone]  . senna (SENOKOT) tablet 8.6 mg  1 tablet Oral Daily PRN Basilio Cairo, NP      . sodium chloride flush (NS) 0.9 % injection 3 mL  3 mL Intravenous Q12H Belva Crome, MD   3 mL at 02/12/18 2200  . sodium chloride flush (NS) 0.9 % injection 3 mL  3 mL Intravenous PRN Belva Crome, MD         Discharge Medications: Please see discharge summary for  a list of discharge medications.  Relevant Imaging Results:  Relevant Lab Results:   Additional Information SSN: [id number]  Estanislado Emms, LCSW

## 2018-02-14 ENCOUNTER — Other Ambulatory Visit: Payer: Self-pay

## 2018-02-14 LAB — COMPREHENSIVE METABOLIC PANEL
ALT: 18 U/L (ref 0–44)
ANION GAP: 7 (ref 5–15)
AST: 23 U/L (ref 15–41)
Albumin: 2.2 g/dL — ABNORMAL LOW (ref 3.5–5.0)
Alkaline Phosphatase: 87 U/L (ref 38–126)
BUN: 14 mg/dL (ref 6–20)
CO2: 27 mmol/L (ref 22–32)
Calcium: 9 mg/dL (ref 8.9–10.3)
Chloride: 105 mmol/L (ref 98–111)
Creatinine, Ser: 0.44 mg/dL (ref 0.44–1.00)
GFR calc Af Amer: >60 mL/min (ref >60)
GFR calc non Af Amer: >60 mL/min (ref >60)
Glucose, Bld: 161 mg/dL — ABNORMAL HIGH (ref 70–99)
POTASSIUM: 3.9 mmol/L (ref 3.5–5.1)
Sodium: 139 mmol/L (ref 135–145)
Total Bilirubin: 0.3 mg/dL (ref 0.3–1.2)
Total Protein: 6 g/dL — ABNORMAL LOW (ref 6.5–8.1)

## 2018-02-14 LAB — CBC WITH DIFFERENTIAL/PLATELET
Abs Immature Granulocytes: 0.07 10*3/uL (ref 0.00–0.07)
BASOS PCT: 1 %
Basophils Absolute: 0.1 10*3/uL (ref 0.0–0.1)
Eosinophils Absolute: 0.3 10*3/uL (ref 0.0–0.5)
Eosinophils Relative: 2 %
HCT: 35.5 % — ABNORMAL LOW (ref 36.0–46.0)
Hemoglobin: 10.5 g/dL — ABNORMAL LOW (ref 12.0–15.0)
Immature Granulocytes: 1 %
Lymphocytes Relative: 8 %
Lymphs Abs: 1 10*3/uL (ref 0.7–4.0)
MCH: 26.1 pg (ref 26.0–34.0)
MCHC: 29.6 g/dL — ABNORMAL LOW (ref 30.0–36.0)
MCV: 88.3 fL (ref 80.0–100.0)
Monocytes Absolute: 1 10*3/uL (ref 0.1–1.0)
Monocytes Relative: 8 %
NRBC: 0 % (ref 0.0–0.2)
Neutro Abs: 11 10*3/uL — ABNORMAL HIGH (ref 1.7–7.7)
Neutrophils Relative %: 80 %
Platelets: 581 10*3/uL — ABNORMAL HIGH (ref 150–400)
RBC: 4.02 MIL/uL (ref 3.87–5.11)
RDW: 15.1 % (ref 11.5–15.5)
WBC: 13.5 10*3/uL — AB (ref 4.0–10.5)

## 2018-02-14 LAB — PHOSPHORUS: Phosphorus: 2.6 mg/dL (ref 2.5–4.6)

## 2018-02-14 LAB — MAGNESIUM: Magnesium: 1.9 mg/dL (ref 1.7–2.4)

## 2018-02-14 NOTE — Plan of Care (Signed)
  Problem: Education: Goal: Knowledge of General Education information will improve Description: Including pain rating scale, medication(s)/side effects and non-pharmacologic comfort measures Outcome: Progressing   Problem: Clinical Measurements: Goal: Respiratory complications will improve Outcome: Progressing   Problem: Activity: Goal: Risk for activity intolerance will decrease Outcome: Progressing   

## 2018-02-14 NOTE — Progress Notes (Signed)
Physical Therapy Treatment Patient Details Name: Suzanne Carroll MRN: [id number] DOB: 1960/12/04 Today's Date: 02/14/2018    History of Present Illness 58 y.o. 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  Initially refused chemotherapy and was found to have a large pericardial effusion with RV compression and significant mitral inflow variation with respiration but patient declined pericardiocentesis few days ago and wanted to be closer to home.  Readmitted with worsening shortness of breath with chest CTA showing extensive right lower lobe tumor with bulky thoracic lymphadenopathy large pericardial effusion and multifocal opacities with possible pneumonia and osseous metastasis.  She is now transferred here for possible pericardial window.    PT Comments    Pt admitted with above diagnosis. Pt currently with functional limitations due to the deficits listed below (see PT Problem List). Pt was able to ambulate with RW but needing much more assist than last treatment. Pt much less steady and weaker than previous treatment.  Will follow acutely.   Pt will benefit from skilled PT to increase their independence and safety with mobility to allow discharge to the venue listed below.     Follow Up Recommendations  SNF;Supervision/Assistance - 24 hour     Equipment Recommendations  Rolling walker with 5" wheels;3in1 (PT)    Recommendations for Other Services       Precautions / Restrictions Precautions Precautions: Fall Restrictions Weight Bearing Restrictions: No    Mobility  Bed Mobility Overal bed mobility: Needs Assistance Bed Mobility: Sit to Supine;Supine to Sit     Supine to sit: Min guard Sit to supine: Min assist   General bed mobility comments: Min A to bring  RLE over EOB. Increased time and effort required throughout  min assist to get LES into bed.   Transfers Overall transfer level: Needs  assistance Equipment used: Rolling walker (2 wheeled) Transfers: Sit to/from Stand Sit to Stand: Min assist;From elevated surface         General transfer comment: Min A for stability and power up into standing.  Pt requests light touch on gait belt as her torso hurts per pt.  Ambulation/Gait Ambulation/Gait assistance: Min assist Gait Distance (Feet): 45 Feet Assistive device: Rolling walker (2 wheeled) Gait Pattern/deviations: Step-to pattern;Decreased step length - right;Decreased step length - left;Shuffle;Ataxic;Drifts right/left;Trunk flexed;Narrow base of support   Gait velocity interpretation: <1.31 ft/sec, indicative of household ambulator General Gait Details: Pt with unsteady gait with at least one LOB needing physical assist to correct.  LEs also buckling at times needing steadying assist as well.  Pt with difficulty separating feet and with narrow BOS which did not help pt with gait.  Even with RW, PT had to provide assist to pt. Pt needed more assist than last treatment and was overall more weak.     Stairs             Wheelchair Mobility    Modified Rankin (Stroke Patients Only)       Balance Overall balance assessment: Needs assistance Sitting-balance support: No upper extremity supported;Feet supported Sitting balance-Leahy Scale: Fair     Standing balance support: Bilateral upper extremity supported;During functional activity Standing balance-Leahy Scale: Poor Standing balance comment: relies on UE support and external support for balance.                             Cognition Arousal/Alertness: Awake/alert Behavior During Therapy:  Anxious Overall Cognitive Status: No family/caregiver present to determine baseline cognitive functioning Area of Impairment: Attention;Memory;Following commands;Safety/judgement;Awareness;Problem solving                   Current Attention Level: Sustained Memory: Decreased short-term  memory Following Commands: Follows one step commands with increased time;Follows one step commands inconsistently Safety/Judgement: Decreased awareness of safety Awareness: Emergent Problem Solving: Slow processing;Requires verbal cues        Exercises General Exercises - Lower Extremity Ankle Circles/Pumps: AROM;Both;10 reps;Seated Long Arc Quad: AROM;Both;10 reps;Seated    General Comments General comments (skin integrity, edema, etc.): Removed O2 and pt states she can't breathe. Sats 90-92%.  REplaced O2 at 2 L and pt above 93% rest of ttreatment.       Pertinent Vitals/Pain Pain Assessment: Faces Faces Pain Scale: Hurts even more Pain Location: Chest and LLE Pain Descriptors / Indicators: Constant;Discomfort;Grimacing;Guarding Pain Intervention(s): Limited activity within patient's tolerance;Monitored during session;Repositioned    Home Living                      Prior Function            PT Goals (current goals can now be found in the care plan section) Acute Rehab PT Goals Patient Stated Goal: "I want to walk again." Progress towards PT goals: Progressing toward goals    Frequency    Min 2X/week      PT Plan Current plan remains appropriate    Co-evaluation              AM-PAC PT "6 Clicks" Mobility   Outcome Measure  Help needed turning from your back to your side while in a flat bed without using bedrails?: None Help needed moving from lying on your back to sitting on the side of a flat bed without using bedrails?: None Help needed moving to and from a bed to a chair (including a wheelchair)?: A Little Help needed standing up from a chair using your arms (e.g., wheelchair or bedside chair)?: A Little Help needed to walk in hospital room?: A Lot Help needed climbing 3-5 steps with a railing? : A Lot 6 Click Score: 18    End of Session Equipment Utilized During Treatment: Gait belt;Oxygen Activity Tolerance: Patient limited by  fatigue Patient left: with call bell/phone within reach;in bed Nurse Communication: Mobility status PT Visit Diagnosis: Unsteadiness on feet (R26.81);Muscle weakness (generalized) (M62.81)     Time: 9937-1696 PT Time Calculation (min) (ACUTE ONLY): 24 min  Charges:  $Gait Training: 8-22 mins $Therapeutic Exercise: 8-22 mins                     [place] Pager:  [phone]  Office:  [place] 02/14/2018, 10:45 AM

## 2018-02-14 NOTE — Progress Notes (Signed)
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  She was hospitalized from 12/7-12/29 at [facility] with pathological acute anterior trochanteric fracture of the left hip requiring surgical intervention on 12/8.  During that time a CT angiogram of the chest, abdomen and pelvis showed multiple tumors involving the kidneys, pancreas, liver, lung, right adrenal, peritoneum, retroperitoneum and bone with lumbar vertebral involvement.  Bone biopsy done during surgery showed finding of small cell cancer likely lung primary. Oncology was consulted during hospitalization and offered chemotherapy and radiation however patient refused and wanted to return back with her family in [place] and get worked up.  She was also found to have large pericardial effusion and underwent TEE on 12/12 showing inconsistent pericardial effusion with no signs of RV collapse, compression.  Repeat echo on 12/28 showed large pericardial effusion with elevated intrapericardial pressure and early tamponade physiology.  Patient however refused intervention and was sent to skilled nursing facility. However she started having worsening shortness of breath and pleuritic chest pain radiating to her back on 12/30.  Also complained of leg swelling and shin pain.  She was presented at end of hospital ED where she was tachycardic in low 100s, tachypneic with stable blood pressure and afebrile.  O2 sat of 98% on 2 L.  Blood work showed mild leukocytosis, hemoglobin of 11.6, platelets of 577, K of 3.3, normal renal function.  CT angiogram of the chest showed extensive tumor in the right lower lobe with bulky thoracic lymphadenopathy, progressive metastases along the right heart border with a large right-sided pericardial effusion and small-to-moderate right pleural effusion, small  left pleural effusion, multifocal lytic osseous metastases.  Patient transferred to [facility] for cardiology evaluation and pericardial window but not a candidate due to her widespread metastatic disease and obstruction of the Right Mainstem Bronchus and Partial Obstruction of the Left. Have stopped the IV Lasix because of concern of Hastening Pre-load reduction and worsening of Pericardial Effusion causing cardiac tamponade but because CXR showed mild CHF will try po Lasix. Discussed with the patient about what could happen with pre-load reduction and Cardiac Tamponade and hastening demise and she is ok with po Lasix for now.  She has been accepted to skilled nursing facility and now just waiting insurance authorization.  Assessment & Plan:   Principal Problem:   Malignant pericardial effusion (HCC) Active Problems:   SOB (shortness of breath)   Small cell lung cancer (HCC)   S/P pericardiocentesis   Palliative care by specialist   Goals of care, counseling/discussion   Cardiac tamponade   Cancer related pain   Anxiety state   Protein-calorie malnutrition, severe   Palliative care encounter  Recurrent Pericardial Effusion ([place])  -2D echo showed large pericardial effusion with tamponade physiology.   -CVTS consulted by cardiology who reviewed the CT scan which shows total obstruction of the right mainstem bronchus and subtotal obstruction of the left mainstem bronchus.   -Dr. Darcey Nora recommended she is not a candidate for pericardial window since once she gets intubated she would be ventilator dependent. -Pericardiocentesis  performed on 12/31 with 900 cc fluid removed.   -Tolerated procedure well.  Fluid sent for studies, no growth so far. Cytology showed No malignant cells identified -Now has lower leg edema.  Also has abdominal distention which is new for the past 2 days but is  improved -D/C'd IV Lasix 40 mg Daily been concern for causing preload reduction and hastening cardiac  decompensation in the setting of likely cardiac tamponade -Repeat ECHO showed Moderate to Large Pericardial Effusion today and showed that when compared to 1231 the effusion has reaccumulated and is smaller than prior to pericardiocentesis but larger than at the end of procedure -Cardiology removed Drain today as they cannot leave it in due to risk of infection.  And cardiology feels that because of her terminal illness and her reaccumulating pericardial effusion with minimal drainage it is futile at this point given her extremely poor prognosis -Patient is in very much denial about this illness and has not come to grips with her mortality  -Transferred out to telemetry.  Cardiology no longer following. -Cardiology is recommending hospice and palliative care working with patient patient does not want to speak with palliative care; cardiology feels she has no other options and feels like she will decompensate in the setting of likely cardiac tamponade and states that they will not repeat a pericardiocentesis -Disdontinued IV Lasix at this time because of her worsening and reaccumulating pericardial effusion quickly and we do not want to culture due to her preload reduction that would cause hastening of decompensation in the setting of likely cardiac tamponade that will happen; her prognosis is extremely poor; Will transition to po Lasix as CXR showed interval decrease size of cardiac silhouette, Bilatera Pleural Effusions with R > L and Pulmonary Vascular Congestion -Oncology evaluated and because she has this incurable condition treatment is all palliative and Dr. Earlie Server gave her the option of palliative care and hospice referral versus consideration of palliative systemic chemotherapy with carboplatin and etoposide and Tecntriq however does not want any current treatment at this time as she became visibly upset  -Will continue Palliative Conversations as patient remains FULL CODE -Patient wishes to go to  SNF and will consider getting second opinion eleswhere; have asked PT OT to reevaluate given that she cannot go back to her old facility; PT evaluated and OT to Re-evaluate and this has been done -Social Work in the process of assisting with placement and patient has been accepted to skilled nursing facility however has no insurance authorization currently -She will need oxygen at discharge along with other therapies she desaturated to walk screen yesterday and will need 2 L oxygen while ambulating with a rolling walker with 5 inch wheels and a 3 and 1   Metastatic Small Cell Lung Cancer ([place]) -Multiple visceral and osseous metastases as outlined above.   -Oncology consulted.  Dr. Julien Nordmann saw patient on 1/1 and discussed at length with her regarding her prognosis and treatment options.   -She has incurable disease and treatment will only be palliative.   -He gave her the option of palliative care with hospice referral versus considering palliative systemic chemotherapy with carboplatin, etoposide and Tecentriq.  -Her prognosis without treatment would be <3 months and with treatment the average survival could be up to 9 months depending upon how she responds to the treatment. -Patient would like to get her treatment in [place] close to home and Dr. Worthy Flank office will arrange appointment with oncologist in [place]. -Palliative care for goals of care discussion. -Patient has not come to grips with her terminal illness and becomes upset when discussing her mortality and prognosis -As above -We will discontinue IV Lasix given concern of preload reduction but start po Lasix 40 mg given CXR Findings -Will need to follow up with Oncology for Palliative  Chemotherapy if she so desires; Today she did not want to even think about her cancer and was focused on her hip and is in denial   Acute Respiratory Failure with Hypoxia -Improved after pericardiocentesis but fluid has recaccumulated.  Now back on  oxygen when ambulating  -Home O2 screen done and she needs at least 2 L; she was seen wearing supplemental oxygen via nasal cannula after she had just exerted herself and walked around the room -Because her fluid accumulated very quickly this prognosis very poor and it is again recommended to her to go to hospice however she refuses -CXR yesterday AM "The cardiac silhouette remains enlarged. This is consistent with the known effusion. Right lower lobe and pleural-based tumor is again seen. Right greater than left pleural effusions are similar. Bibasilar airspace disease is unchanged." -Will Try p.o. Lasix with the understanding that the this could still cause pre-load reduction in the setting of increased Cardiac Pressures causing Tamponade and ultimate demise -Will need oxygen at discharge to skilled nursing facility  Severe Protein Calorie malnutrition/cachexia -Nutrition consult appreciated -Recommending Liberalizeing Diet   Hypokalemia -Improved. K+ was 3.9 -Continue to Monitor and Replete as Necessary -Repeat CMP in AM   Recent Left Hip Fracture -Status post repair about 3 weeks back.  Reports she was ambulating with a walker prior to coming to the hospital.   -PT ordered now that pericardial drain removed and recommending SNF and attempting to get to SNF today -Patient was very agitated this morning and is wanted to leave [place] as she is not getting possible I discussed with her about placement and she is agreeable to stay and have social worker send out referrals to try and get insurance approval.  Leukocytosis -Fluctuating WBC went from 14.8 -> 12.5 -> 10.9 -> 10.1 -> 11.5 -> 11.6 -> 13.5 -Slightly worsening and in the setting of Malignancy  -Continue to Monitor for S/Sx of Infection -Repeat CBC in AM  Thrombocytosis -In the setting of Malignancy -Patient's Platelet Count went from 457 ->581 -Continue to Monitor  -Repeat CBC in AM     Hypophosphatemia -Patient's Phos Level this AM was 2.6 -Continue to Monitor and Replete as Necessary -Repeat Phos Level in AM   Normocytic Anemia -Patient's Hb/Hct stable and is 10.5/35.5 -In the Setting of Malignancy -Repeat CBC in AM   DVT prophylaxis: Heparin 5,000 units sq q8h Code Status: FULL CODE Family Communication: No family present at bedside Disposition Plan: It is recommended that she go to Hospice as Cardiology has no options for her. Her prognosis is extremely poor but patient does not want to talk to [place] so she will be D/C'd back to SNF  Consultants:   Palliative Care  Cardiology   Procedures: ECHOCARDIOGRAM 02/07/18 ------------------------------------------------------------------- Study Conclusions  - Left ventricle: Systolic function was vigorous. The estimated   ejection fraction was in the range of 65% to 70%. - Pericardium, extracardiac: A large pericardial effusion was   identified circumferential to the heart. There was right atrial   and right ventricular chamber collapse. Features were consistent   with tamponade physiology.  Impressions:  - Post pericardiocentesis images show resolution of pericardial   effusion.  [place] 02/09/2018 ------------------------------------------------------------------- Study Conclusions  - Left ventricle: Systolic function was normal. The estimated   ejection fraction was in the range of 60% to 65%. - Pulmonary arteries: PA peak pressure: 34 mm Hg (S). - Pericardium, extracardiac: A moderate to large pericardial   effusion was identified.  Impressions:  - Limited study to reassess pericardial effusion; normal LV   function; moderate to large pericardial effusion; mild RA   collapse; no RV diastolic collapse; IVC not dilated; when   compared to 02/07/18, effusion has reaccumulated (smaller than   prior to pericardiocentesis but larger than at end of  procedure).   Antimicrobials:  Anti-infectives (From admission, onward)   None     Subjective: Seen and examined at bedside was resting and wearing supplemental oxygen.  She states that she just ambulated around the room and needed to rest.  States that her shortness of breath is only when she exerts herself.  No nausea or vomiting.  States abdomen is slightly distended but coming down.  No other concerns or complaints at this time and is agreeable to going to SNF.  I discussed with her that her insurance authorization may not come today and she is okay with that and likely will be discharged tomorrow pending insurance authorization.  Objective: Vitals:   02/13/18 1124 02/13/18 1948 02/14/18 0512 02/14/18 0900  BP: (!) 118/92 105/75 109/86 107/82  Pulse: (!) 119 (!) 111 (!) 116   Resp: (!) 22     Temp:  98.1 F (36.7 C) 97.9 F (36.6 C)   TempSrc:  Oral Oral   SpO2: 100% 95% 97%   Weight:   46.1 kg   Height:        Intake/Output Summary (Last 24 hours) at 02/14/2018 1038 Last data filed at 02/14/2018 0600 Gross per 24 hour  Intake 1200 ml  Output 600 ml  Net 600 ml   Filed Weights   02/06/18 2220 02/12/18 0616 02/14/18 0512  Weight: 46.2 kg 46 kg 46.1 kg   Examination: Physical Exam:  Constitutional: Very thin and chronically ill-appearing Caucasian female currently who is appearing calm and she is more pleasant today.  She is okay with going to skilled nursing facility  Eyes: Sclera anicteric.  Lids and conjunctive are normal ENMT: External ears nose appear normal.  Grossly normal hearing Respiratory: Diminished auscultation bilaterally no appreciable wheezing, rales, rhonchi.  Patient was wearing supplemental oxygen via nasal cannula did have some mild crackles on auscultation at the bases Cardiovascular: Diminished to auscultation but slightly tachycardic but regular rate.  Has some lower extremity edema which is mild Abdomen: Soft, nontender, distended slightly.  Bowel  sounds present GU: Deferred Musculoskeletal: No contractures or cyanosis.  No joint deformities noted Skin: Is warm and dry no appreciable rashes or lesions limited skin evaluation Neurologic: Cranial nerves II through XII grossly intact no appreciable focal deficits Psychiatric: Anxious mood but appears depressed and has a slightly flat affect.  She is awake and alert and oriented x3   Data Reviewed: I have personally reviewed following labs and imaging studies  CBC: Recent Labs  Lab 02/10/18 0945 02/11/18 0400 02/12/18 0443 02/13/18 0649 02/14/18 0904  WBC 10.9* 10.1 11.5* 11.6* 13.5*  NEUTROABS 8.8* 7.6 8.5* 8.6* 11.0*  HGB 11.5* 10.6* 10.6* 10.7* 10.5*  HCT 38.1 33.7* 34.2* 34.3* 35.5*  MCV 87.2 87.5 88.4 88.6 88.3  PLT 451* 488* 500* 536* 196*   Basic Metabolic Panel: Recent Labs  Lab 02/10/18 0303 02/10/18 0945 02/11/18 0400 02/12/18 0443 02/13/18 0649 02/14/18 0904  NA 140  --  139 140 140 139  K 3.8  --  3.8 3.7 4.0 3.9  CL 106  --  105 107 108 105  CO2 25  --  26 26 25 27   GLUCOSE 106*  --  107* 118* 103* 161*  BUN 10  --  6 13 12 14   CREATININE 0.41*  --  0.47 0.47 0.45 0.44  CALCIUM 7.7*  --  7.6* 8.1* 8.5* 9.0  MG  --  1.8 1.8 1.9 1.9 1.9  PHOS  --  2.1* 2.0* 2.6 2.6 2.6   GFR: Estimated Creatinine Clearance: 55.1 mL/min (by C-G formula based on SCr of 0.44 mg/dL). Liver Function Tests: Recent Labs  Lab 02/11/18 0400 02/12/18 0443 02/13/18 0649 02/14/18 0904  AST 24 25 25 23   ALT 16 15 17 18   ALKPHOS 75 75 82 87  BILITOT 0.1* 0.3 0.3 0.3  PROT 5.4* 5.7* 5.8* 6.0*  ALBUMIN 2.1* 2.1* 2.1* 2.2*   No results for input(s): LIPASE, AMYLASE in the last 168 hours. No results for input(s): AMMONIA in the last 168 hours. Coagulation Profile: No results for input(s): INR, PROTIME in the last 168 hours. Cardiac Enzymes: Recent Labs  Lab 02/07/18 1639  TROPONINI 0.03*   BNP (last 3 results) No results for input(s): PROBNP in the last 8760  hours. HbA1C: No results for input(s): HGBA1C in the last 72 hours. CBG: No results for input(s): GLUCAP in the last 168 hours. Lipid Profile: No results for input(s): CHOL, HDL, LDLCALC, TRIG, CHOLHDL, LDLDIRECT in the last 72 hours. Thyroid Function Tests: No results for input(s): TSH, T4TOTAL, FREET4, T3FREE, THYROIDAB in the last 72 hours. Anemia Panel: No results for input(s): VITAMINB12, FOLATE, FERRITIN, TIBC, IRON, RETICCTPCT in the last 72 hours. Sepsis Labs: No results for input(s): PROCALCITON, LATICACIDVEN in the last 168 hours.  Recent Results (from the past 240 hour(s))  Gram stain     Status: None   Collection Time: 02/07/18  1:17 PM  Result Value Ref Range Status   Specimen Description PERITONEAL  Final   Special Requests FLUID  Final   Gram Stain   Final    RARE WBC PRESENT, PREDOMINANTLY MONONUCLEAR NO ORGANISMS SEEN Performed at [facility], [address], [place], [place] [address]    Report Status 02/07/2018 FINAL  Final  Culture, body fluid-bottle     Status: None   Collection Time: 02/07/18  3:17 PM  Result Value Ref Range Status   Specimen Description PERITONEAL  Final   Special Requests NONE  Final   Culture NO GROWTH 5 DAYS  Final   Report Status 02/12/2018 FINAL  Final  MRSA PCR Screening     Status: None   Collection Time: 02/07/18  4:22 PM  Result Value Ref Range Status   MRSA by PCR NEGATIVE NEGATIVE Final    Comment:        The GeneXpert MRSA Assay (FDA approved for NASAL specimens only), is one component of a comprehensive MRSA colonization surveillance program. It is not intended to diagnose MRSA infection nor to guide or monitor treatment for MRSA infections. Performed at [facility], [place] [address], [place], [place] [address]     Radiology Studies: Dg Chest Port 1 View  Result Date: 02/13/2018 CLINICAL DATA:  Shortness of breath. EXAM: PORTABLE CHEST 1 VIEW COMPARISON:  Chest x-ray 02/12/2018.  CT chest 02/06/2018.  FINDINGS: The cardiac silhouette remains enlarged. This is consistent with the known effusion. Right lower lobe and pleural-based tumor is again seen. Right greater than left pleural effusions are similar. Bibasilar airspace disease is unchanged. IMPRESSION: 1. Stable or lying cardiomegaly. 2. Bibasilar airspace disease and effusions are stable. 3. Right hilar and lower lobe tumor. Electronically Signed  By: San Morelle M.D.   On: 02/13/2018 07:25    Scheduled Meds: . furosemide  40 mg Oral Daily  . heparin  5,000 Units Subcutaneous Q8H  . morphine  15 mg Oral Q8H  . potassium & sodium phosphates  1 packet Oral BID WC  . sodium chloride flush  3 mL Intravenous Q12H   Continuous Infusions: . sodium chloride      LOS: 8 days   Kerney Elbe, DO [facility] PAGER is on AMION  If 7PM-7AM, please contact night-coverage [email or web address] Password [facility] 02/14/2018, 10:38 AM

## 2018-02-14 NOTE — Progress Notes (Signed)
Physical Therapy Treatment Patient Details Name: Suzanne Carroll MRN: [id number] DOB: June 04, 1960 Today's Date: 02/14/2018    History of Present Illness 58 y.o. female transferred here from [facility] with metastatic small cell lung CA with mets to the kidney pancreas liver spine and lungs.  Initially refused chemotherapy and was found to have a large pericardial effusion with RV compression and significant mitral inflow variation with respiration but patient declined pericardiocentesis few days ago and wanted to be closer to home.  Readmitted with worsening shortness of breath with chest CTA showing extensive right lower lobe tumor with bulky thoracic lymphadenopathy large pericardial effusion and multifocal opacities with possible pneumonia and osseous metastasis.  She is now transferred here for possible pericardial window.    PT Comments    This PT was asked to help pt get to bathroom as pt emergently needed to go and no one able to assist. Pt highly anxious upon entering room, ripped off 02 trying to get to EOB. Min A for bed mobility and Min A for SPT from bed to/from [facility]. Pt with increased RR and HR up to 132 bpm with Sp02 90% on RA. Pt distracted by pain and anxiety and needs to be redirected to stay on task. Performed some bridging in bed to assist with donning underwear with rest breaks due to fatigue. Will follow.   Follow Up Recommendations  SNF;Supervision/Assistance - 24 hour     Equipment Recommendations  Rolling walker with 5" wheels;3in1 (PT)    Recommendations for Other Services       Precautions / Restrictions Precautions Precautions: Fall Restrictions Weight Bearing Restrictions: No    Mobility  Bed Mobility Overal bed mobility: Needs Assistance Bed Mobility: Supine to Sit     Supine to sit: Min assist Sit to supine: Min assist   General bed mobility comments: Min A to bring RLE to EOB. Increased effort needed, Assist to bring LLE into bed.   Transfers Overall transfer  level: Needs assistance Equipment used: None Transfers: Sit to/from [facility] Sit to Stand: Min assist;From elevated surface Stand pivot transfers: Min assist;From elevated surface       General transfer comment: Min A for stability and power up into standing from EOB x1, from [facility] x1. SPT bed to/from [facility] with Min A for safety/stability.   Ambulation/Gait          Stairs             Wheelchair Mobility    Modified Rankin (Stroke Patients Only)       Balance Overall balance assessment: Needs assistance Sitting-balance support: No upper extremity supported;Feet supported Sitting balance-Leahy Scale: Fair     Standing balance support: No upper extremity supported Standing balance-Leahy Scale: Poor Standing balance comment: Requires UE support and external support for balance btu able to perform pericare with min A.                             Cognition Arousal/Alertness: Awake/alert Behavior During Therapy: Anxious(highly anxious) Overall Cognitive Status: No family/caregiver present to determine baseline cognitive functioning Area of Impairment: Attention;Memory;Following commands;Safety/judgement;Awareness;Problem solving                   Current Attention Level: Sustained Memory: Decreased short-term memory Following Commands: Follows one step commands with increased time;Follows one step commands inconsistently Safety/Judgement: Decreased awareness of safety Awareness: Emergent Problem Solving: Slow processing;Requires verbal cues General Comments: Highly anxious and needs redirection to  focus on task. Distracted by pain and anxiety. Trying to do things fast despite safety issues.       Exercises     General Comments General comments (skin integrity, edema, etc.): Pt removed 02 for transfer, highly anxious througbout. Sp02 90% on RA post return to bed. Replaced 02 per request.       Pertinent Vitals/Pain Pain  Assessment: Faces Faces Pain Scale: Hurts a little bit Pain Location: generalized  Pain Descriptors / Indicators: Grimacing;Constant Pain Intervention(s): Monitored during session;Repositioned    Home Living                      Prior Function            PT Goals (current goals can now be found in the care plan section) Acute Rehab PT Goals Patient Stated Goal: "I want to walk again." Progress towards PT goals: Progressing toward goals    Frequency    Min 2X/week      PT Plan Current plan remains appropriate    Co-evaluation              AM-PAC PT "6 Clicks" Mobility   Outcome Measure  Help needed turning from your back to your side while in a flat bed without using bedrails?: None Help needed moving from lying on your back to sitting on the side of a flat bed without using bedrails?: A Little Help needed moving to and from a bed to a chair (including a wheelchair)?: A Little Help needed standing up from a chair using your arms (e.g., wheelchair or bedside chair)?: A Little Help needed to walk in hospital room?: A Lot Help needed climbing 3-5 steps with a railing? : A Lot 6 Click Score: 17    End of Session Equipment Utilized During Treatment: Gait belt;Oxygen Activity Tolerance: Patient limited by fatigue Patient left: with call bell/phone within reach;in bed Nurse Communication: Mobility status PT Visit Diagnosis: Unsteadiness on feet (R26.81);Muscle weakness (generalized) (M62.81)     Time: 8366-2947 PT Time Calculation (min) (ACUTE ONLY): 13 min  Charges:  $Therapeutic Activity: 8-22 mins                     Wray Kearns, PT, DPT Acute Rehabilitation Services Pager [phone] Office [place] 02/14/2018, 12:42 PM

## 2018-02-15 ENCOUNTER — Inpatient Hospital Stay (HOSPITAL_COMMUNITY): Payer: BLUE CROSS/BLUE SHIELD

## 2018-02-15 LAB — COMPREHENSIVE METABOLIC PANEL
ALBUMIN: 2.3 g/dL — AB (ref 3.5–5.0)
ALT: 16 U/L (ref 0–44)
ANION GAP: 7 (ref 5–15)
AST: 27 U/L (ref 15–41)
Alkaline Phosphatase: 80 U/L (ref 38–126)
BUN: 13 mg/dL (ref 6–20)
CO2: 31 mmol/L (ref 22–32)
Calcium: 8.6 mg/dL — ABNORMAL LOW (ref 8.9–10.3)
Chloride: 101 mmol/L (ref 98–111)
Creatinine, Ser: 0.49 mg/dL (ref 0.44–1.00)
GFR calc Af Amer: >60 mL/min (ref >60)
GFR calc non Af Amer: >60 mL/min (ref >60)
Glucose, Bld: 97 mg/dL (ref 70–99)
Potassium: 4.3 mmol/L (ref 3.5–5.1)
Sodium: 139 mmol/L (ref 135–145)
Total Bilirubin: 0.4 mg/dL (ref 0.3–1.2)
Total Protein: 6 g/dL — ABNORMAL LOW (ref 6.5–8.1)

## 2018-02-15 LAB — CBC WITH DIFFERENTIAL/PLATELET
Abs Immature Granulocytes: 0.05 10*3/uL (ref 0.00–0.07)
Basophils Absolute: 0.1 10*3/uL (ref 0.0–0.1)
Basophils Relative: 1 %
Eosinophils Absolute: 0.2 10*3/uL (ref 0.0–0.5)
Eosinophils Relative: 1 %
HCT: 35.9 % — ABNORMAL LOW (ref 36.0–46.0)
Hemoglobin: 10.7 g/dL — ABNORMAL LOW (ref 12.0–15.0)
IMMATURE GRANULOCYTES: 1 %
LYMPHS PCT: 10 %
Lymphs Abs: 1 10*3/uL (ref 0.7–4.0)
MCH: 26.2 pg (ref 26.0–34.0)
MCHC: 29.8 g/dL — ABNORMAL LOW (ref 30.0–36.0)
MCV: 88 fL (ref 80.0–100.0)
Monocytes Absolute: 1.1 10*3/uL — ABNORMAL HIGH (ref 0.1–1.0)
Monocytes Relative: 11 %
Neutro Abs: 7.9 10*3/uL — ABNORMAL HIGH (ref 1.7–7.7)
Neutrophils Relative %: 76 %
Platelets: 614 10*3/uL — ABNORMAL HIGH (ref 150–400)
RBC: 4.08 MIL/uL (ref 3.87–5.11)
RDW: 15.2 % (ref 11.5–15.5)
WBC: 10.4 10*3/uL (ref 4.0–10.5)
nRBC: 0 % (ref 0.0–0.2)

## 2018-02-15 LAB — MAGNESIUM: Magnesium: 2.1 mg/dL (ref 1.7–2.4)

## 2018-02-15 LAB — PHOSPHORUS: Phosphorus: 3.4 mg/dL (ref 2.5–4.6)

## 2018-02-15 MED ORDER — FUROSEMIDE 40 MG PO TABS: 40.0000 mg | ORAL_TABLET | Freq: Every day | ORAL | Status: AC

## 2018-02-15 MED ORDER — ZOLPIDEM TARTRATE 10 MG PO TABS: 10.0000 mg | ORAL_TABLET | Freq: Every evening | ORAL | 0 refills | Status: AC | PRN

## 2018-02-15 MED ORDER — ACETAMINOPHEN 325 MG PO TABS: 650.0000 mg | ORAL_TABLET | ORAL | Status: AC | PRN

## 2018-02-15 MED ORDER — OXYCODONE HCL 5 MG/5ML PO SOLN: 10.0000 mg | Freq: Three times a day (TID) | ORAL | 0 refills | Status: AC | PRN

## 2018-02-15 MED ORDER — CALCIUM CARBONATE ANTACID 500 MG PO CHEW
1.0000 | CHEWABLE_TABLET | Freq: Three times a day (TID) | ORAL | Status: DC | PRN
  Administered 2018-02-15: 200 mg via ORAL
  Filled 2018-02-15 (×2): qty 1

## 2018-02-15 MED ORDER — SENNA 8.6 MG PO TABS: 1.0000 | ORAL_TABLET | Freq: Every day | ORAL | 0 refills | Status: AC | PRN

## 2018-02-15 MED ORDER — OXYCODONE HCL 5 MG/5ML PO SOLN: 10.0000 mg | ORAL | Status: DC | PRN

## 2018-02-15 MED ORDER — OXYCODONE HCL 5 MG/5ML PO SOLN
15.0000 mg | Freq: Four times a day (QID) | ORAL | Status: DC
  Administered 2018-02-15 (×2): 15 mg via ORAL
  Filled 2018-02-15 (×2): qty 15

## 2018-02-15 MED ORDER — CALCIUM CARBONATE ANTACID 500 MG PO CHEW: 1.0000 | CHEWABLE_TABLET | Freq: Three times a day (TID) | ORAL | Status: AC | PRN

## 2018-02-15 MED ORDER — OXYCODONE HCL 5 MG/5ML PO SOLN: 15.0000 mg | Freq: Four times a day (QID) | ORAL | 0 refills | Status: AC

## 2018-02-15 NOTE — Social Work (Signed)
[facility] has received [employer] authorization for patient to admit to SNF today. Updated MD. CSW to follow and support with discharge planning.  Estanislado Emms, LCSW [phone]

## 2018-02-15 NOTE — Clinical Social Work Placement (Signed)
   CLINICAL SOCIAL WORK PLACEMENT  NOTE  Date:  02/15/2018  Patient Details  Name: Suzanne Carroll MRN: [id number] Date of Birth: 20-Jan-1961  Clinical Social Work is seeking post-discharge placement for this patient at the [place] level of care (*CSW will initial, date and re-position this form in  chart as items are completed):  Yes   Patient/family provided with [facility] Work Department's list of facilities offering this level of care within the geographic area requested by the patient (or if unable, by the patient's family).  Yes   Patient/family informed of their freedom to choose among providers that offer the needed level of care, that participate in Medicare, Medicaid or managed care program needed by the patient, have an available bed and are willing to accept the patient.  Yes   Patient/family informed of [facility]'s ownership interest in [facility] and [facility], as well as of the fact that they are under no obligation to receive care at these facilities.  PASRR submitted to EDS on       PASRR number received on       Existing PASRR number confirmed on 02/13/18     FL2 transmitted to all facilities in geographic area requested by pt/family on 02/13/18     FL2 transmitted to all facilities within larger geographic area on       Patient informed that his/her managed care company has contracts with or will negotiate with certain facilities, including the following:  [facility]     Yes   Patient/family informed of bed offers received.  Patient chooses bed at [facility] and [facility] recommends and patient chooses bed at      Patient to be transferred to [facility] on 02/15/18.  Patient to be transferred to facility by [facility]     Patient family notified on 02/15/18 of transfer.  Name of family member notified:        PHYSICIAN Please prepare priority discharge summary,  including medications, Please prepare prescriptions     Additional Comment:    _______________________________________________ Estanislado Emms, LCSW 02/15/2018, 11:13 AM

## 2018-02-15 NOTE — Progress Notes (Addendum)
Daily Progress Note   Patient Name: Suzanne Carroll       Date: 02/15/2018 DOB: January 30, 1961  Age: 58 y.o. MRN#: [id number] Attending Physician: Desiree Hane, MD Primary Care Physician: Patient, No Pcp Per Admit Date: 02/06/2018  Reason for Consultation/Follow-up: Establishing goals of care, Pain control and Psychosocial/spiritual support  Subjective: Checked on patient's symptoms of pain and anxiety.  Per patient pain is tolerable and stable, but she is having difficulty swallowing pills.   With regard to anxiety medication - the patient described a paradoxical reaction to ativan.  "I went beserk!"  The RN substantiated her report.   Patient requesting Tums for GERD.   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  Patient is walking, eating and has capacity.     Patient Profile/HPI:  58 y.o. female  with past medical history of COPD, smoker admitted on 02/06/2018 with shortness of breath and pleuritic chest pain radiating to back. Patient hospitalized at [facility] 12/7-12/29 with pathologic acute anterior trochanteric fracture of left hip requiring surgical intervention on 12/8. CT scans revealed multiple tumors involving kidneys, pancrease, liver, lung, right adrenal, peritoneum, retroperitoneum and bone with lumbar vertebral involvement. Biopsy revealed small cell cancer likely primary lung. Also found to have large pericardial effusion and underwent TEE on 12/12 with no signs of RV collapse or compression. Patient discharged to SNF but readmitted for above symptoms. CT angio chest shows extensive tumor in right lower lobe with bulky thoracic lymphadenopathy, progressive metastases along right hear border  with large right-sided pericardial effusion, bilateral pleural effusions, and multifocal lytic osseous metastases. Repeat ECHO 12/28 revealed large pericardial effusion with elevated intrapericardial pressure/early tamponade physiology. Patient not a candidate for pericardial window. S/p pericardiocentesis 12/31 with 900 cc removed. Oncology consulted and has discussed palliative treatment options and prognosis with patient. Palliative medicine consultation for goals of care.     Length of Stay: 9  Current Medications: Scheduled Meds:  . furosemide  40 mg Oral Daily  . heparin  5,000 Units Subcutaneous Q8H  . oxyCODONE  15 mg Oral Q6H  . potassium & sodium phosphates  1 packet Oral BID WC  . sodium chloride flush  3 mL Intravenous Q12H    Continuous Infusions: . sodium chloride  PRN Meds: sodium chloride, acetaminophen, calcium carbonate, ipratropium-albuterol, morphine injection, ondansetron (ZOFRAN) IV, oxyCODONE, senna, sodium chloride flush  Physical Exam        Thin, frail, appears anxious.  NAD cv tachycardic resp no distress, no increased work of breathing.  Vital Signs: BP 102/81 (BP Location: Left Arm)   Pulse (!) 113   Temp 97.6 F (36.4 C) (Oral)   Resp (!) 22   Ht 5\' 1"  (1.549 m)   Wt 43.4 kg   SpO2 92%   BMI 18.08 kg/m  SpO2: SpO2: 92 % O2 Device: O2 Device: Room Air O2 Flow Rate: O2 Flow Rate (L/min): 2 L/min  Intake/output summary: No intake or output data in the 24 hours ending 02/15/18 1449 LBM: Last BM Date: 02/11/18 Baseline Weight: Weight: 46.2 kg Most recent weight: Weight: 43.4 kg       Palliative Assessment/Data: 60%    Flowsheet Rows     Most Recent Value  Intake Tab  Referral Department  Hospitalist  Unit at Time of Referral  ICU  Palliative Care Primary Diagnosis  Cancer  Date Notified  02/09/18  Palliative Care Type  New Palliative care  Reason for referral  Clarify Goals of Care  Date of Admission  02/06/18  Date first seen  by Palliative Care  02/09/18  # of days Palliative referral response time  0 Day(s)  # of days IP prior to Palliative referral  3  Clinical Assessment  Palliative Performance Scale Score  40%  Psychosocial & Spiritual Assessment  Palliative Care Outcomes  Patient/Family meeting held?  Yes  Who was at the meeting?  patient  Palliative Care Outcomes  Clarified goals of care, Improved pain interventions, Improved non-pain symptom therapy, Provided end of life care assistance, Provided psychosocial or spiritual support, Linked to palliative care logitudinal support, ACP counseling assistance      Patient Active Problem List   Diagnosis Date Noted  . Protein-calorie malnutrition, severe 02/10/2018  . Palliative care encounter   . Palliative care by specialist   . Goals of care, counseling/discussion   . Cardiac tamponade   . Cancer related pain   . Anxiety state   . S/P pericardiocentesis 02/08/2018  . SOB (shortness of breath) 02/07/2018  . Malignant pericardial effusion ([place]) 02/07/2018  . Small cell lung cancer ([place]) 02/07/2018    Palliative Care Plan    Recommendations/Plan:  DC to SNF for rehab.  Patient not ready to face her diagnosis.  She will most likely return to the hospital in the near team.  I changed her long acting oxycontin to roxicodone solution (15 mg solution q 6 hours scheduled and 10 mg solution TID PRN breakthru pain).   I recommend discharging her to SNF on this pain regimen.  Avoid benzodiazepines  Goals of Care and Additional Recommendations:  Limitations on Scope of Treatment: Full Scope Treatment  Code Status:  Full code  Prognosis:   < 6 months given rapidly progressive widely metastatic small cell lung cancer.   Discharge Planning:  [place].  Care plan was discussed with patient and RN  Thank you for allowing the Palliative Medicine Team to assist in the care of this patient.  Total time spent:  25 min.     Greater  than 50%  of this time was spent counseling and coordinating care related to the above assessment and plan.  Florentina Jenny, PA-C Palliative Medicine  Please contact Palliative MedicineTeam phone at [phone] for questions and concerns between 7  am - 7 pm.   Please see AMION for individual provider pager numbers.

## 2018-02-15 NOTE — Discharge Summary (Signed)
Discharge Summary  Suzanne Carroll PPI:[id number] DOB: Jun 09, 1960  PCP: Patient, No Pcp Per  Admit date: 02/06/2018 Discharge date: 02/15/2018   Time spent: < 25 minutes  Admitted From: SNF Disposition:  SNF  Recommendations for Outpatient Follow-up:  1. Follow up with PCP in 1-2 weeks 2. Have had multiple conversations with patient regarding poor prognosis and oncology/palliative care recommendations for consideration of palliative chemotherapy versus hospice services.  Patient clearly understands the risks and ultimate concern for demise if no further treatment.  She is considering second opinion upon discharge 3. Pain regimen has been changed given difficulty swallowing (escalated to movement invasion) Roxicodone 15 mg every 6 hours scheduled, 10 mg solution 3 times daily as needed) 4. Lasix daily for pericardial effusion 5. Using 2 L O2    Discharge Diagnoses:  Active Hospital Problems   Diagnosis Date Noted  . 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 Protein-calorie malnutrition, severe 02/10/2018  . Palliative care encounter   . Palliative care by specialist   . Goals of care, counseling/discussion   . Cardiac tamponade   . Cancer related pain   . Anxiety state   . S/P pericardiocentesis 02/08/2018  . SOB (shortness of breath) 02/07/2018  . Small cell lung cancer ([place]) 02/07/2018    Resolved Hospital Problems  No resolved problems to display.    Discharge Condition: Guarded   CODE STATUS:FULL CODE   History of present illness:  Suzanne Carroll is a 58 y.o. year old female with medical history significant for recently diagnosed metastatic small cell carcinoma (to the bone), recent admission to [place] for pathologic left femur fracture (12/7-12/29) who presented on 02/06/2018 as a transfer from [place] with worsening dyspnea from her facility and was found to have large pericardial effusion with RV compression. Remaining hospital course addressed in  problem based format below:   Hospital Course:    Malignant pericardial effusion, recurrent Prior to this admission had previously declined any intervention of known pericardial effusion during last hospitalization at [facility] (12/2-12/29).  After transfer from [facility] to [facility], she underwent pericardiocentesis for symptomatic pericardial effusion with RV compression on 02/07/18 of greater than 800 cc of grossly bloody pericardial fluid.  Postprocedure echo showed resolution of effusion.  Fluid analysis negative for infection, presumed to be negative given her widely metastatic lung cancer.  Repeat echo 1/2 showed reaccumulation so drain was kept in place before ultimately being removed due to risk for potential concern of infection.  Patient was not a pericardial window candidate.  She understands that it will most likely reaccumulate and can ultimately lead to her demise.  She continues to decline hospice/comfort care, chemotherapy.  She would like to consider a second opinion upon discharge.  During hospital stay she tolerated oral Lasix, it was explained to patient the concern that this could worsen her symptoms given the reduction in preload that could cause tamponade and ultimate demise.  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  CT scan of chest abdomen and pelvis on 12/7 at [facility] showed nodular soft tissue mass in right lower lobe with central hypoattenuation likely represent necrosis favoring primary malignancy versus metastatic disease with extension into the right hilum and posterior mediastinum.  She was evaluated by oncology (Dr. Earlie Server) who given the extensive nature of her disease recommended palliative care/hospice or palliative systemic chemotherapy.  She  continues to decline  further oncologic treatment.  She understands the risk of not proceeding with treatment upon discharge with ultimate risk of demise/death.  Palliative care was also brought on board the patient continues to decline palliative care/hospice services.  She would like to consider getting a second opinion after discharge.    Acute hypoxic respiratory failure, likely multifactorial etiology Some improvement after pericardiocentesis but continued to desat even with ambulation.  Met criteria for 2 L.  Also likely resulted of extensive metastatic disease, has persistent right-sided pleural effusion (small).  Patient is adamant with any further procedures; she understands the ultimate risk of worsening effusion and death would like to consider getting a second opinion upon discharge.  Persistent right-sided pleural effusion with right base atelectasis.  Initially found on admitting chest x-ray is remained stable in size.  Patient is requiring oxygen.  Patient is not amenable to any further procedures;she understands the ultimate risk of worsening effusion and death but would like to consider getting a second opinion upon discharge  Pain.  Having difficulty swallowing pills to try a long-acting OxyContin to Roxicodone solution ('15mg'$  solution every 6 hours scheduled and '10mg'$  solution 3 times daily PRN breakthrough pain)  Recent left intertrochanteric hip fracture Occurred prior to hospitalization underwent nail fixation of left hip fracture at previous facility [facility]).  Pathology from that procedure showed high-grade neuroendocrine carcinoma consistent with small cell carcinoma pulmonary origin  Consultations:  Palliative, oncology, cardiology  Procedures/Studies: Pericardiocentesis: 02/07/2018 Discharge Exam: BP 102/81 (BP Location: Left Arm)   Pulse (!) 113   Temp 97.6 F (36.4 C) (Oral)   Resp (!) 22   Ht '5\' 1"'$  (1.549 m)   Wt 43.4 kg   SpO2 92%   BMI 18.08 kg/m   General: Cachectic  female, in no distress Eyes: EOMI, anicteric ENT: Oral Mucosa clear and moist Cardiovascular: regular rate and rhythm, no murmurs, rubs or gallops, no edema, Respiratory: Normal respiratory effort on 2 L nasal cannula, lungs clear to auscultation bilaterally Abdomen: Firm but nontender, slightly distended, normal bowel sounds Skin: No Rash Neurologic: Grossly no focal neuro deficit.Mental status AAOx3, speech normal,    Discharge Instructions You were cared for by a hospitalist during your hospital stay. If you have any questions about your discharge medications or the care you received while you were in the hospital after you are discharged, you can call the unit and asked to speak with the hospitalist on call if the hospitalist that took care of you is not available. Once you are discharged, your primary care physician will handle any further medical issues. Please note that NO REFILLS for any discharge medications will be authorized once you are discharged, as it is imperative that you return to your primary care physician (or establish a relationship with a primary care physician if you do not have one) for your aftercare needs so that they can reassess your need for medications and monitor your lab values.  Discharge Instructions    Diet - low sodium heart healthy   Complete by:  As directed    Increase activity slowly   Complete by:  As directed      Allergies as of 02/15/2018      Reactions   Aspirin Hives   Sulfa Antibiotics Nausea Only, Rash      Medication List    STOP taking these medications   oxyCODONE-acetaminophen 10-325 MG tablet Commonly known as:  PERCOCET     TAKE these medications  acetaminophen 325 MG tablet Commonly known as:  TYLENOL Take 2 tablets (650 mg total) by mouth every 4 (four) hours as needed for headache or mild pain.   calcium carbonate 500 MG chewable tablet Commonly known as:  TUMS - dosed in mg elemental calcium Chew 1 tablet (200 mg of  elemental calcium total) by mouth 3 (three) times daily as needed for indigestion or heartburn.   furosemide 40 MG tablet Commonly known as:  LASIX Take 1 tablet (40 mg total) by mouth daily. Start taking on:  February 16, 2018   oxyCODONE 5 MG/5ML solution Commonly known as:  ROXICODONE Take 10 mLs (10 mg total) by mouth every 8 (eight) hours as needed for up to 5 days for breakthrough pain.   oxyCODONE 5 MG/5ML solution Commonly known as:  ROXICODONE Take 15 mLs (15 mg total) by mouth every 6 (six) hours for 5 days.   senna 8.6 MG Tabs tablet Commonly known as:  SENOKOT Take 1 tablet (8.6 mg total) by mouth daily as needed for moderate constipation.   zolpidem 10 MG tablet Commonly known as:  AMBIEN Take 1 tablet (10 mg total) by mouth at bedtime as needed for sleep.      Allergies  Allergen Reactions  . Aspirin Hives  . Sulfa Antibiotics Nausea Only and Rash   Contact information for after-discharge care    Destination    [facility]erred [facility] .   Service:  Skilled Nursing Contact information: [address] [place] [address] [phone]               The results of significant diagnostics from this hospitalization (including imaging, microbiology, ancillary and laboratory) are listed below for reference.    Significant Diagnostic Studies: Dg Chest Port 1 View  Result Date: 02/15/2018 CLINICAL DATA:  Shortness of breath. Follow-up. EXAM: PORTABLE CHEST 1 VIEW COMPARISON:  02/13/2018 FINDINGS: Cardiomegaly and aortic atherosclerosis as seen previously. Mild atelectasis at the left base. Persistent right effusion with moderate atelectasis at the right base. Upper lungs remain clear. IMPRESSION: Persistent right effusion with right base atelectasis. Mild left base atelectasis. Electronically Signed   By: Nelson Chimes M.D.   On: 02/15/2018 07:09   Dg Chest Port 1 View  Result Date: 02/13/2018 CLINICAL DATA:  Shortness of breath.  EXAM: PORTABLE CHEST 1 VIEW COMPARISON:  Chest x-ray 02/12/2018.  CT chest 02/06/2018. FINDINGS: The cardiac silhouette remains enlarged. This is consistent with the known effusion. Right lower lobe and pleural-based tumor is again seen. Right greater than left pleural effusions are similar. Bibasilar airspace disease is unchanged. IMPRESSION: 1. Stable or lying cardiomegaly. 2. Bibasilar airspace disease and effusions are stable. 3. Right hilar and lower lobe tumor. Electronically Signed   By: [place] Morelle M.D.   On: 02/13/2018 07:25   Dg Chest Port 1 View  Result Date: 02/12/2018 CLINICAL DATA:  Shortness of breath and COPD EXAM: PORTABLE CHEST 1 VIEW COMPARISON:  02/07/2018 FINDINGS: Interval decrease size of cardiac silhouette. Bilateral pleural effusions are noted, right greater in left. There is pulmonary vascular congestion identified. Previous right upper lobe asymmetric airspace opacity has resolved in the interval. IMPRESSION: 1. Mild congestive heart failure. 2. Decrease in size of cardiac silhouette Electronically Signed   By: Kerby Moors M.D.   On: 02/12/2018 07:30   Dg Chest Port 1 View  Result Date: 02/07/2018 CLINICAL DATA:  Cardiac tamponade. Shortness of breath. Small-cell lung cancer. EXAM: PORTABLE CHEST 1 VIEW COMPARISON:  None. FINDINGS: There is marked enlargement of the cardiac silhouette. Pulmonary vascularity is at the upper limits of normal. There are small bilateral pleural effusions, right greater than left. There is slight haziness in both upper lobes most likely representing mild pulmonary edema. Aortic atherosclerosis. No significant bone abnormality. Calcified left breast implant. IMPRESSION: Enlarged cardiac silhouette consistent with pericardial effusion. Mild pulmonary edema. Small effusions, right greater than left. Electronically Signed   By: Lorriane Shire M.D.   On: 02/07/2018 12:43    Microbiology: Recent Results (from the past 240 hour(s))  Gram stain      Status: None   Collection Time: 02/07/18  1:17 PM  Result Value Ref Range Status   Specimen Description PERITONEAL  Final   Special Requests FLUID  Final   Gram Stain   Final    RARE WBC PRESENT, PREDOMINANTLY MONONUCLEAR NO ORGANISMS SEEN Performed at [facility], [address] [place], [place] [address]    Report Status 02/07/2018 FINAL  Final  Culture, body fluid-bottle     Status: None   Collection Time: 02/07/18  3:17 PM  Result Value Ref Range Status   Specimen Description PERITONEAL  Final   Special Requests NONE  Final   Culture NO GROWTH 5 DAYS  Final   Report Status 02/12/2018 FINAL  Final  MRSA PCR Screening     Status: None   Collection Time: 02/07/18  4:22 PM  Result Value Ref Range Status   MRSA by PCR NEGATIVE NEGATIVE Final    Comment:        The GeneXpert MRSA Assay (FDA approved for NASAL specimens only), is one component of a comprehensive MRSA colonization surveillance program. It is not intended to diagnose MRSA infection nor to guide or monitor treatment for MRSA infections. Performed at [facility], [place] [address], [place], [place] [address]      Labs: Basic Metabolic Panel: Recent Labs  Lab 02/11/18 0400 02/12/18 0443 02/13/18 0649 02/14/18 0904 02/15/18 0451  NA 139 140 140 139 139  K 3.8 3.7 4.0 3.9 4.3  CL 105 107 108 105 101  CO2 '26 26 25 27 31  '$ GLUCOSE 107* 118* 103* 161* 97  BUN '6 13 12 14 13  '$ CREATININE 0.47 0.47 0.45 0.44 0.49  CALCIUM 7.6* 8.1* 8.5* 9.0 8.6*  MG 1.8 1.9 1.9 1.9 2.1  PHOS 2.0* 2.6 2.6 2.6 3.4   Liver Function Tests: Recent Labs  Lab 02/11/18 0400 02/12/18 0443 02/13/18 0649 02/14/18 0904 02/15/18 0451  AST '24 25 25 23 27  '$ ALT '16 15 17 18 16  '$ ALKPHOS 75 75 82 87 80  BILITOT 0.1* 0.3 0.3 0.3 0.4  PROT 5.4* 5.7* 5.8* 6.0* 6.0*  ALBUMIN 2.1* 2.1* 2.1* 2.2* 2.3*   No results for input(s): LIPASE, AMYLASE in the last 168 hours. No results for input(s): AMMONIA in the last 168  hours. CBC: Recent Labs  Lab 02/11/18 0400 02/12/18 0443 02/13/18 0649 02/14/18 0904 02/15/18 0451  WBC 10.1 11.5* 11.6* 13.5* 10.4  NEUTROABS 7.6 8.5* 8.6* 11.0* 7.9*  HGB 10.6* 10.6* 10.7* 10.5* 10.7*  HCT 33.7* 34.2* 34.3* 35.5* 35.9*  MCV 87.5 88.4 88.6 88.3 88.0  PLT 488* 500* 536* 581* 614*   Cardiac Enzymes: No results for input(s): CKTOTAL, CKMB, CKMBINDEX, TROPONINI in the last 168 hours. BNP: BNP (last 3 results) No results for input(s): BNP in the last 8760 hours.  ProBNP (last 3 results) No results for input(s): PROBNP in the  last 8760 hours.  CBG: No results for input(s): GLUCAP in the last 168 hours.     Signed:  Desiree Hane, MD [facility] 02/15/2018, 1:13 PM

## 2018-02-15 NOTE — Social Work (Signed)
Patient will discharge to [facility]. Anticipated discharge date: 02/15/18 Transportation by: [place]  Nurse to call report to [phone].  CSW signing off.  Estanislado Emms, [place]  Clinical Social Worker

## 2018-02-15 NOTE — Progress Notes (Signed)
Occupational Therapy Treatment Patient Details Name: Suzanne Carroll MRN: [id number] DOB: 05/18/1960 Today's Date: 02/15/2018    History of present illness 58 y.o. 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  Initially refused chemotherapy and was found to have a large pericardial effusion with RV compression and significant mitral inflow variation with respiration but patient declined pericardiocentesis few days ago and wanted to be closer to home.  Readmitted with worsening shortness of breath with chest CTA showing extensive right lower lobe tumor with bulky thoracic lymphadenopathy large pericardial effusion and multifocal opacities with possible pneumonia and osseous metastasis.  She is now transferred here for possible pericardial window.   OT comments  Pt continues to be limited by current diagnosis and anxiety. Pt on RA O2 sats remain >90% and on 2L O2, pt remains >94% with pursed lip breathing technique. Pt minA for bed mobility with HOB elevated mostly for RLE to get on/off bed. Pt performing UB dressing sitting EOB with set-upA; MinA for LB ADL as pt's pain too severe to bend for RLE to donn into pants. Pt performing ADL functional mobility with RW and minguardA, no LOB episodes. Pt performing transfer to [facility] for toilet hygiene and clothing management with MinA overall. Pt's balance has improved to fair, but fatigues easily. Pt returned to bed with needs in reach and possible d/c to SNF today. Pt continues to benefit from continued OT skilled services for ADL, mobility and safety in SNF setting.    Follow Up Recommendations  SNF;Supervision/Assistance - 24 hour    Equipment Recommendations       Recommendations for Other Services PT consult    Precautions / Restrictions Precautions Precautions: Fall Restrictions Weight Bearing Restrictions: No       Mobility Bed Mobility Overal bed mobility: Needs  Assistance Bed Mobility: Supine to Sit     Supine to sit: Min guard;HOB elevated Sit to supine: Min assist   General bed mobility comments: Min A to bring RLE to EOB. Increased effort needed, Assist to bring LLE into bed.   Transfers Overall transfer level: Needs assistance   Transfers: Sit to/from Stand;Stand Pivot Transfers   Stand pivot transfers: Min assist;From elevated surface       General transfer comment: Min A for stability and power up into standing from EOB x1, from [facility] x1. SPT bed to/from [facility] with Min A for safety/stability.     Balance Overall balance assessment: Needs assistance Sitting-balance support: No upper extremity supported;Feet supported Sitting balance-Leahy Scale: Fair       Standing balance-Leahy Scale: Fair Standing balance comment: UE support                           ADL either performed or assessed with clinical judgement   ADL Overall ADL's : Needs assistance/impaired     Grooming: Supervision/safety;Set up;Sitting           Upper Body Dressing : Supervision/safety;Sitting   Lower Body Dressing: Moderate assistance;Sit to/from stand   Toilet Transfer: Minimal assistance;Grab bars;RW;BSC   Toileting- Clothing Manipulation and Hygiene: Minimal assistance;Cueing for safety;Sit to/from stand       Functional mobility during ADLs: Min guard;Rolling walker;Cueing for safety General ADL Comments: pt highly anxious requiring frequent redirection     Vision       Perception     Praxis      Cognition Arousal/Alertness: Awake/alert Behavior  During Therapy: Anxious Overall Cognitive Status: Within Functional Limits for tasks assessed Area of Impairment: Safety/judgement;Awareness                       Following Commands: Follows one step commands consistently Safety/Judgement: Decreased awareness of safety   Problem Solving: Requires verbal cues General Comments: Anxious, but easily redirected         Exercises     Shoulder Instructions       General Comments SpO2 levels  >94% on RA, but prefers to wear it with activity due to anxiety    Pertinent Vitals/ Pain       Pain Assessment: No/denies pain  Home Living                                          Prior Functioning/Environment              Frequency  Min 2X/week        Progress Toward Goals  OT Goals(current goals can now be found in the care plan section)  Progress towards OT goals: Progressing toward goals  Acute Rehab OT Goals Patient Stated Goal: "I want to walk again." OT Goal Formulation: With patient Time For Goal Achievement: 02/26/18 Potential to Achieve Goals: Good ADL Goals Pt Will Perform Lower Body Dressing: with min assist;sit to/from stand Pt Will Transfer to Toilet: with min assist;bedside commode;ambulating Pt Will Perform Toileting - Clothing Manipulation and hygiene: with min assist;sit to/from stand;sitting/lateral leans Additional ADL Goal #1: Pt will demonstrate increased problem solving as seen by performing ADL with 2-3 cues  Plan Discharge plan remains appropriate    Co-evaluation                 AM-PAC OT "6 Clicks" Daily Activity     Outcome Measure   Help from another person eating meals?: None Help from another person taking care of personal grooming?: A Little Help from another person toileting, which includes using toliet, bedpan, or urinal?: A Little Help from another person bathing (including washing, rinsing, drying)?: A Little Help from another person to put on and taking off regular upper body clothing?: A Little Help from another person to put on and taking off regular lower body clothing?: A Little 6 Click Score: 19    End of Session Equipment Utilized During Treatment: Gait belt;Rolling walker  OT Visit Diagnosis: Unsteadiness on feet (R26.81);Muscle weakness (generalized) (M62.81) Pain - part of body: Knee(R knee)   Activity  Tolerance Treatment limited secondary to medical complications (Comment);Patient tolerated treatment well   Patient Left in bed;with call bell/phone within reach   Nurse Communication Mobility status        Time: 1400-1430 OT Time Calculation (min): 30 min  Charges: OT General Charges $OT Visit: 1 Visit OT Treatments $Self Care/Home Management : 23-37 mins  Ebony Hail Harold Hedge) Marsa Aris OTR/L Acute Rehabilitation Services Pager: [phone] Office: [phone]    Fredda Hammed 02/15/2018, 3:31 PM

## 2018-02-27 ENCOUNTER — Encounter: Payer: Self-pay | Admitting: *Deleted

## 2018-02-27 NOTE — Progress Notes (Signed)
Oncology Nurse Navigator Documentation  Oncology Nurse Navigator Flowsheets 02/27/2018  Navigator Location [facility]  Navigator Encounter Type Other/I received a call from [facility].  Patient is being seen there for treatment.  Treatment Phase Pre-Tx/Tx Discussion  Barriers/Navigation Needs Coordination of Care  Interventions Coordination of Care  Coordination of Care Other  Acuity Level 1  Time Spent with Patient 15

## 2018-03-11 DEATH — deceased

## 2019-11-16 IMAGING — DX DG CERVICAL SPINE COMPLETE 4+V
5 series · 5 of 5 positions shown · non-contrast
Comparison: None.

CLINICAL DATA: Neck pain.

EXAM:
CERVICAL SPINE - COMPLETE 4+ VIEW

[c-spine lat]
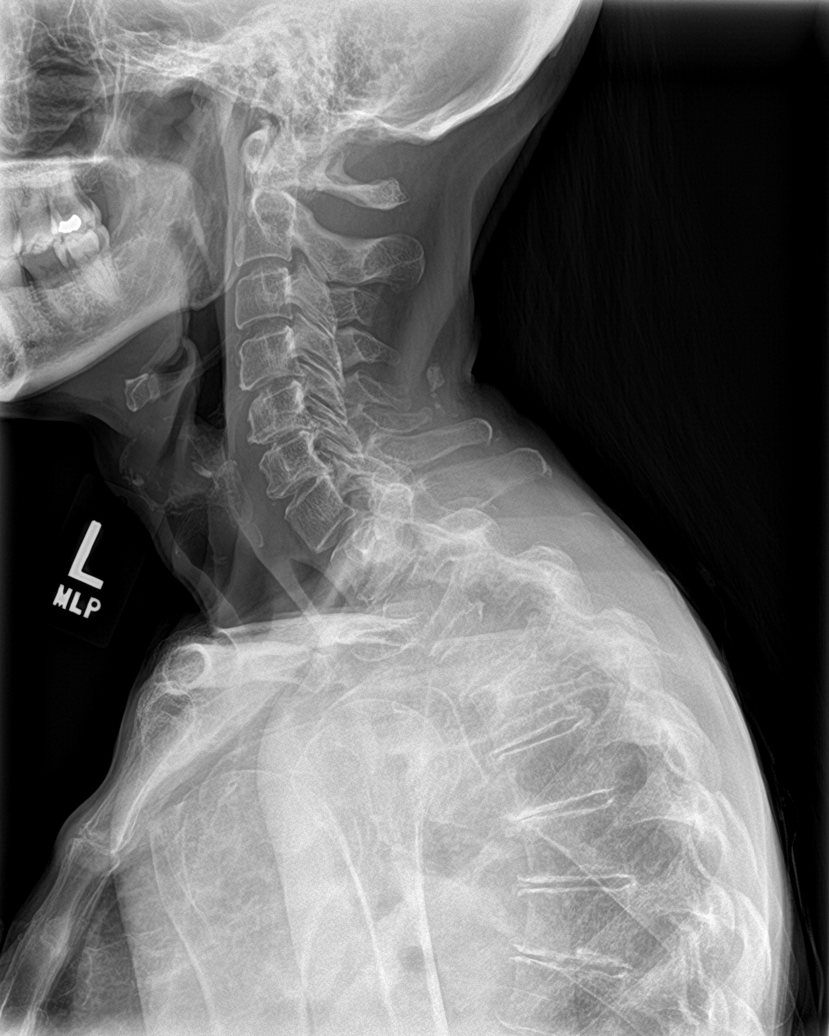

[c-spine obl (1 of 2)]
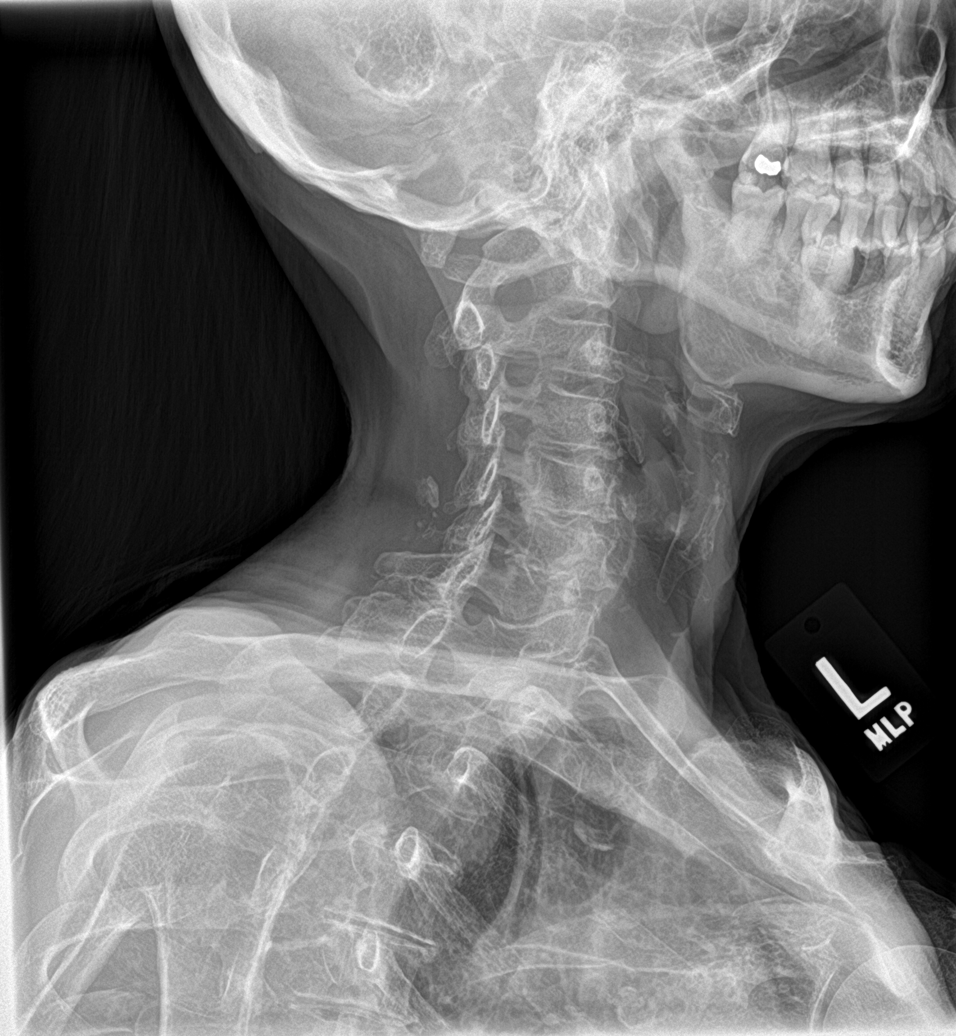

[c-spine obl (2 of 2)]
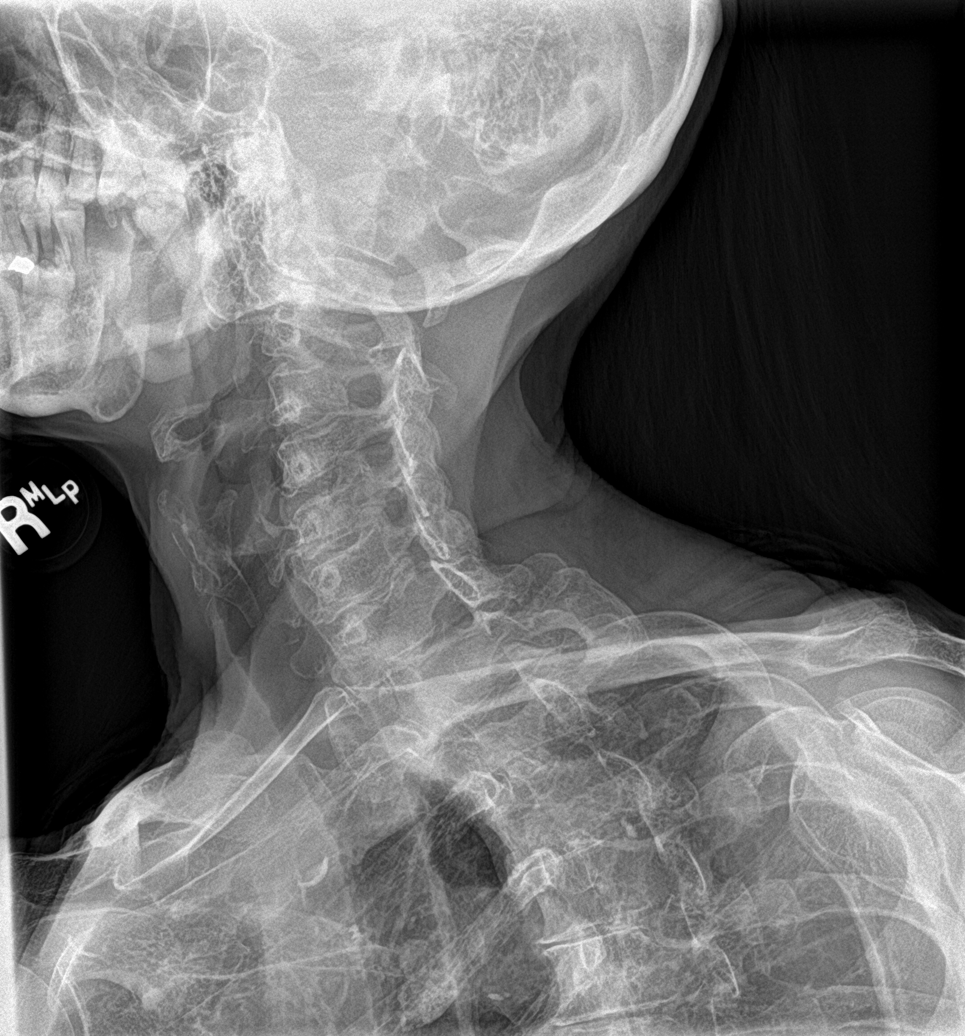

[c-spine ap (1 of 2)]
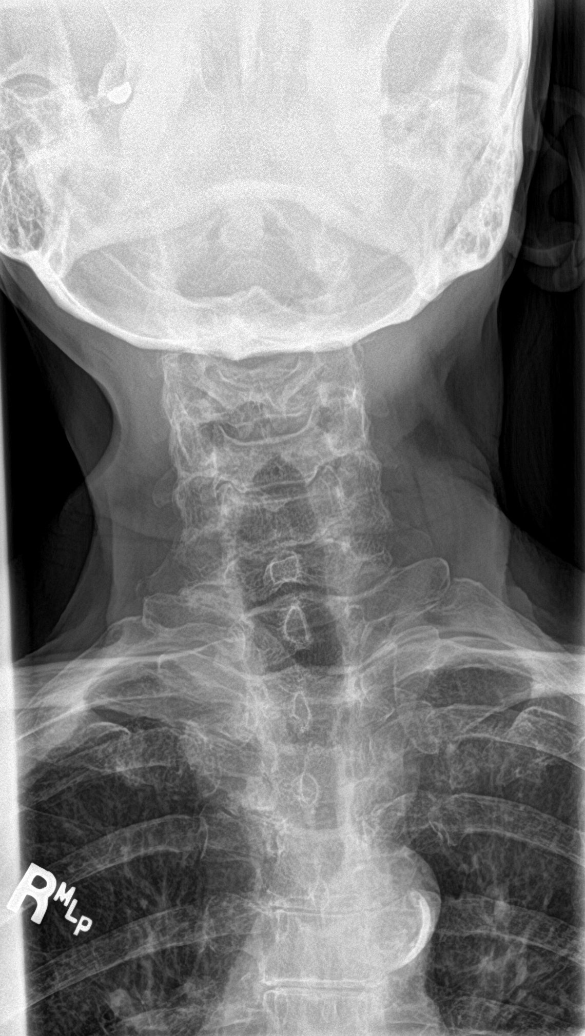

[c-spine ap (2 of 2)]
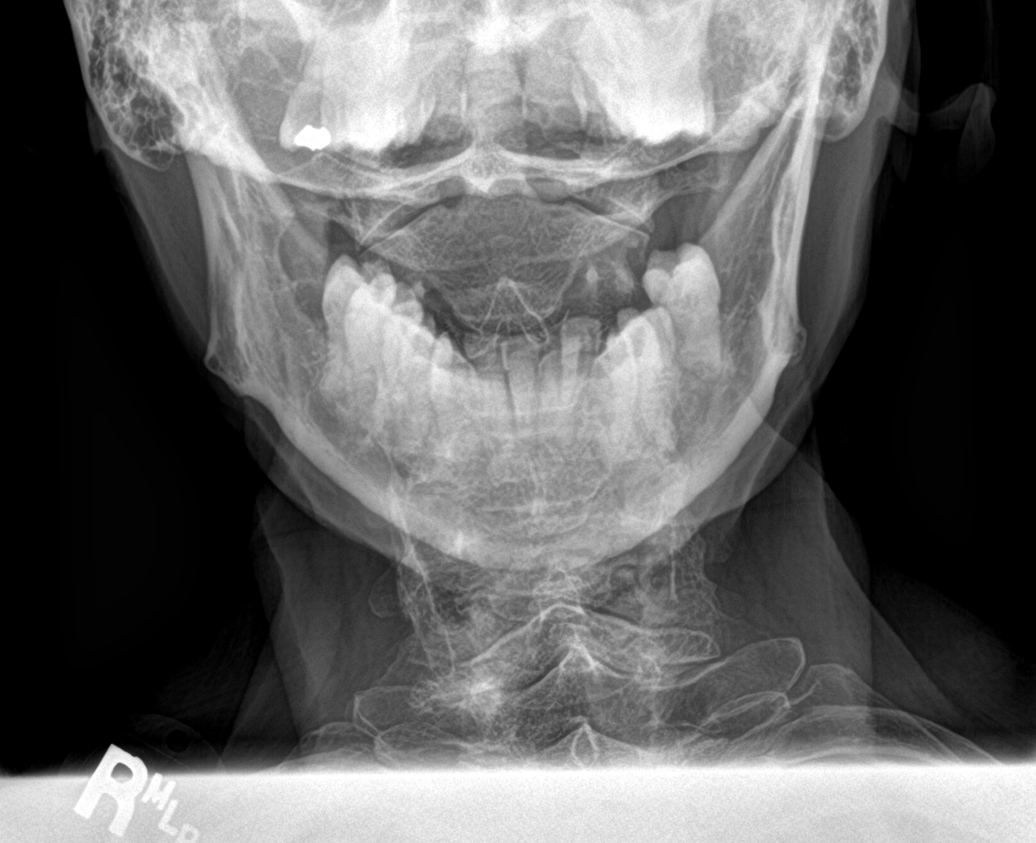

[5 of 5 positions shown; findings below may reference images not displayed]

FINDINGS: No fracture or spondylolisthesis is noted. Mild degenerative disc
disease is noted at C5-6 and C6-7 with anterior osteophyte
formation. No prevertebral soft tissue swelling is noted. Mild
bilateral neural foraminal stenosis is noted at these levels
secondary to uncovertebral spurring.
IMPRESSION: Mild degenerative disc disease is noted at C5-6 and C6-7 with mild
bilateral neural foraminal stenosis at these levels secondary to
uncovertebral spurring. No acute abnormality seen in the cervical
spine.

## 2020-01-25 IMAGING — DX DG CHEST 1V PORT
1 series · 1 of 1 positions shown · non-contrast
Comparison: 02/13/2018

CLINICAL DATA: Shortness of breath. Follow-up.

EXAM:
PORTABLE CHEST 1 VIEW

[chest]
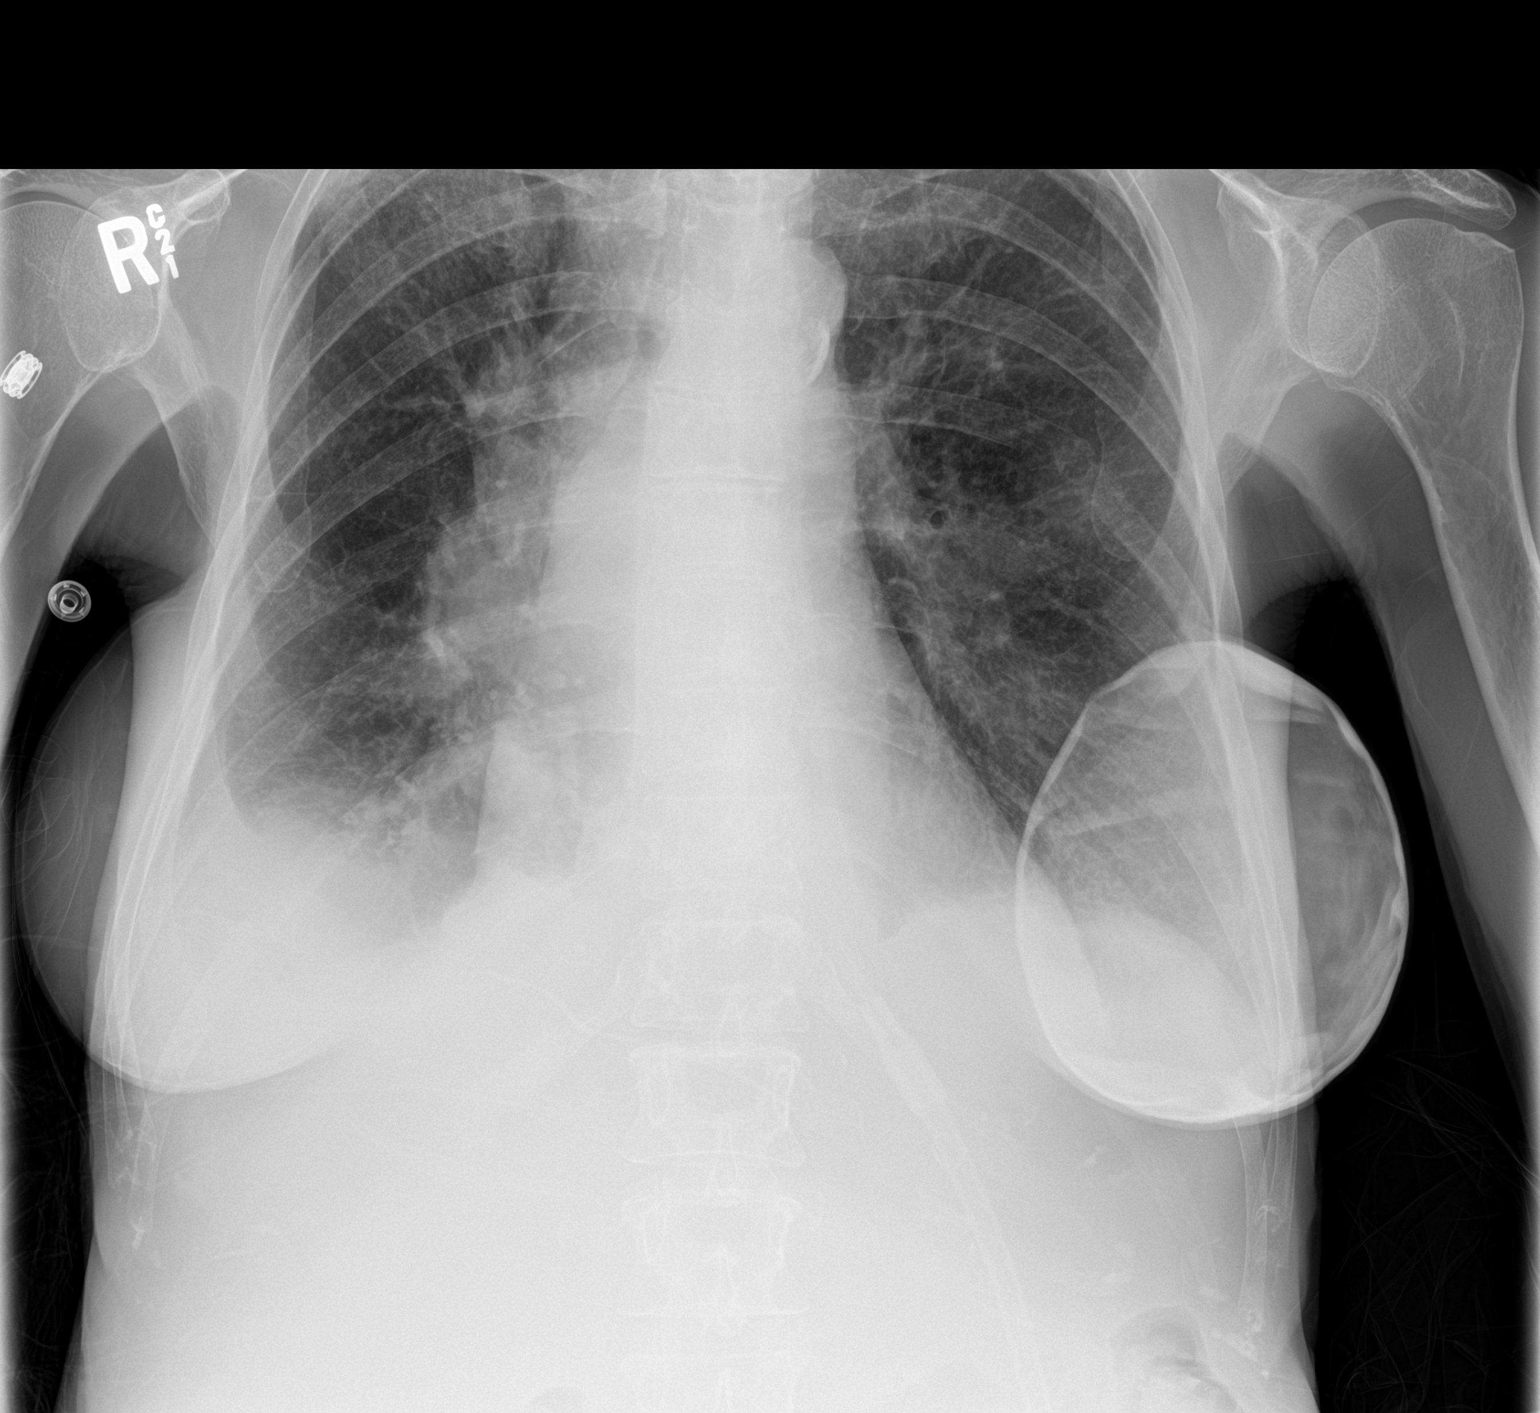

[1 of 1 positions shown; findings below may reference images not displayed]

FINDINGS: Cardiomegaly and aortic atherosclerosis as seen previously. Mild
atelectasis at the left base. Persistent right effusion with
moderate atelectasis at the right base. Upper lungs remain clear.
IMPRESSION: Persistent right effusion with right base atelectasis. Mild left
base atelectasis.
